# Patient Record
Sex: Female | Born: 1965 | Race: White | Hispanic: No | Marital: Married | Smoking: Former smoker
Health system: Southern US, Community
[De-identification: ages and names within clinical notes are randomized; demographics above are authoritative.]

## PROBLEM LIST (undated history)

## (undated) DIAGNOSIS — K1379 Other lesions of oral mucosa: Secondary | ICD-10-CM

## (undated) DIAGNOSIS — H269 Unspecified cataract: Secondary | ICD-10-CM

## (undated) DIAGNOSIS — K224 Dyskinesia of esophagus: Secondary | ICD-10-CM

## (undated) DIAGNOSIS — M1990 Unspecified osteoarthritis, unspecified site: Secondary | ICD-10-CM

## (undated) DIAGNOSIS — I2510 Atherosclerotic heart disease of native coronary artery without angina pectoris: Secondary | ICD-10-CM

## (undated) DIAGNOSIS — C4499 Other specified malignant neoplasm of skin, unspecified: Secondary | ICD-10-CM

## (undated) DIAGNOSIS — R011 Cardiac murmur, unspecified: Secondary | ICD-10-CM

## (undated) DIAGNOSIS — Q2112 Patent foramen ovale: Secondary | ICD-10-CM

## (undated) DIAGNOSIS — E669 Obesity, unspecified: Secondary | ICD-10-CM

## (undated) DIAGNOSIS — G4700 Insomnia, unspecified: Secondary | ICD-10-CM

## (undated) DIAGNOSIS — K219 Gastro-esophageal reflux disease without esophagitis: Secondary | ICD-10-CM

## (undated) DIAGNOSIS — D649 Anemia, unspecified: Secondary | ICD-10-CM

## (undated) DIAGNOSIS — K769 Liver disease, unspecified: Secondary | ICD-10-CM

## (undated) DIAGNOSIS — M797 Fibromyalgia: Secondary | ICD-10-CM

## (undated) DIAGNOSIS — T7840XA Allergy, unspecified, initial encounter: Secondary | ICD-10-CM

## (undated) DIAGNOSIS — D509 Iron deficiency anemia, unspecified: Secondary | ICD-10-CM

## (undated) DIAGNOSIS — K589 Irritable bowel syndrome without diarrhea: Secondary | ICD-10-CM

## (undated) DIAGNOSIS — E785 Hyperlipidemia, unspecified: Secondary | ICD-10-CM

## (undated) HISTORY — PX: OTHER SURGICAL HISTORY: SHX169

## (undated) HISTORY — DX: Atherosclerotic heart disease of native coronary artery without angina pectoris: I25.10

## (undated) HISTORY — DX: Hyperlipidemia, unspecified: E78.5

## (undated) HISTORY — DX: Gastro-esophageal reflux disease without esophagitis: K21.9

## (undated) HISTORY — DX: Cardiac murmur, unspecified: R01.1

## (undated) HISTORY — DX: Insomnia, unspecified: G47.00

## (undated) HISTORY — PX: COLONOSCOPY: SHX174

## (undated) HISTORY — DX: Irritable bowel syndrome, unspecified: K58.9

## (undated) HISTORY — DX: Unspecified osteoarthritis, unspecified site: M19.90

## (undated) HISTORY — DX: Fibromyalgia: M79.7

## (undated) HISTORY — PX: ABDOMINAL HYSTERECTOMY: SHX81

## (undated) HISTORY — DX: Unspecified cataract: H26.9

## (undated) HISTORY — DX: Allergy, unspecified, initial encounter: T78.40XA

## (undated) HISTORY — PX: UPPER GASTROINTESTINAL ENDOSCOPY: SHX188

## (undated) HISTORY — DX: Patent foramen ovale: Q21.12

## (undated) HISTORY — DX: Anemia, unspecified: D64.9

## (undated) HISTORY — DX: Liver disease, unspecified: K76.9

## (undated) HISTORY — DX: Obesity, unspecified: E66.9

## (undated) HISTORY — DX: Other specified malignant neoplasm of skin, unspecified: C44.99

## (undated) HISTORY — DX: Dyskinesia of esophagus: K22.4

## (undated) HISTORY — PX: TONSILLECTOMY: SHX5217

## (undated) HISTORY — DX: Iron deficiency anemia, unspecified: D50.9

## (undated) HISTORY — DX: Other lesions of oral mucosa: K13.79

---

## 1990-04-07 DIAGNOSIS — C801 Malignant (primary) neoplasm, unspecified: Secondary | ICD-10-CM

## 1990-04-07 HISTORY — DX: Malignant (primary) neoplasm, unspecified: C80.1

## 1999-04-08 HISTORY — PX: CHOLECYSTECTOMY: SHX55

## 2002-12-05 ENCOUNTER — Inpatient Hospital Stay (HOSPITAL_COMMUNITY): Admission: RE | Admit: 2002-12-05 | Discharge: 2002-12-08 | Payer: Self-pay | Admitting: Obstetrics and Gynecology

## 2002-12-05 ENCOUNTER — Encounter (INDEPENDENT_AMBULATORY_CARE_PROVIDER_SITE_OTHER): Payer: Self-pay | Admitting: Specialist

## 2003-06-08 ENCOUNTER — Encounter: Payer: Self-pay | Admitting: Internal Medicine

## 2003-06-08 HISTORY — PX: COLONOSCOPY: SHX5424

## 2004-03-13 ENCOUNTER — Other Ambulatory Visit: Admission: RE | Admit: 2004-03-13 | Discharge: 2004-03-13 | Payer: Self-pay | Admitting: Obstetrics and Gynecology

## 2005-05-15 ENCOUNTER — Other Ambulatory Visit: Admission: RE | Admit: 2005-05-15 | Discharge: 2005-05-15 | Payer: Self-pay | Admitting: Obstetrics and Gynecology

## 2005-06-10 ENCOUNTER — Encounter: Admission: RE | Admit: 2005-06-10 | Discharge: 2005-06-10 | Payer: Self-pay | Admitting: Obstetrics and Gynecology

## 2007-01-21 ENCOUNTER — Ambulatory Visit: Payer: Self-pay | Admitting: Internal Medicine

## 2007-01-21 ENCOUNTER — Ambulatory Visit (HOSPITAL_COMMUNITY): Admission: RE | Admit: 2007-01-21 | Discharge: 2007-01-21 | Payer: Self-pay | Admitting: Internal Medicine

## 2007-01-21 LAB — CONVERTED CEMR LAB
AST: 50 units/L — ABNORMAL HIGH (ref 0–37)
Bilirubin, Direct: 0.1 mg/dL (ref 0.0–0.3)
CO2: 26 meq/L (ref 19–32)
Calcium: 9.1 mg/dL (ref 8.4–10.5)
Creatinine, Ser: 0.8 mg/dL (ref 0.4–1.2)
Eosinophils Relative: 2.3 % (ref 0.0–5.0)
HCT: 34.5 % — ABNORMAL LOW (ref 36.0–46.0)
Hemoglobin: 11.6 g/dL — ABNORMAL LOW (ref 12.0–15.0)
IgA: 332 mg/dL (ref 68–378)
Lymphocytes Relative: 29 % (ref 12.0–46.0)
Monocytes Absolute: 0.6 10*3/uL (ref 0.2–0.7)
Monocytes Relative: 6.5 % (ref 3.0–11.0)
Potassium: 4.6 meq/L (ref 3.5–5.1)
RBC: 4.56 M/uL (ref 3.87–5.11)
RDW: 14.8 % — ABNORMAL HIGH (ref 11.5–14.6)
WBC: 8.6 10*3/uL (ref 4.5–10.5)

## 2007-02-19 ENCOUNTER — Ambulatory Visit: Payer: Self-pay | Admitting: Internal Medicine

## 2007-02-19 LAB — CONVERTED CEMR LAB
AST: 49 units/L — ABNORMAL HIGH (ref 0–37)
Albumin: 3.6 g/dL (ref 3.5–5.2)
Ferritin: 91.3 ng/mL (ref 10.0–291.0)
Total Bilirubin: 0.5 mg/dL (ref 0.3–1.2)
Total Protein: 7.3 g/dL (ref 6.0–8.3)
Transferrin: 248.9 mg/dL (ref >=212.0)

## 2007-02-23 ENCOUNTER — Ambulatory Visit: Payer: Self-pay | Admitting: Internal Medicine

## 2007-05-12 DIAGNOSIS — R1319 Other dysphagia: Secondary | ICD-10-CM

## 2007-05-12 DIAGNOSIS — K589 Irritable bowel syndrome without diarrhea: Secondary | ICD-10-CM

## 2009-10-24 ENCOUNTER — Ambulatory Visit: Payer: Self-pay | Admitting: Internal Medicine

## 2009-10-24 DIAGNOSIS — K219 Gastro-esophageal reflux disease without esophagitis: Secondary | ICD-10-CM

## 2009-10-24 DIAGNOSIS — E669 Obesity, unspecified: Secondary | ICD-10-CM

## 2009-10-25 LAB — CONVERTED CEMR LAB
ALT: 66 units/L — ABNORMAL HIGH (ref 0–35)
AST: 63 units/L — ABNORMAL HIGH (ref 0–37)
Albumin: 4 g/dL (ref 3.5–5.2)
Bilirubin, Direct: 0.1 mg/dL (ref 0.0–0.3)
Total Bilirubin: 0.6 mg/dL (ref 0.3–1.2)
Total Protein: 7.6 g/dL (ref 6.0–8.3)

## 2009-10-30 ENCOUNTER — Ambulatory Visit (HOSPITAL_COMMUNITY): Admission: RE | Admit: 2009-10-30 | Discharge: 2009-10-30 | Payer: Self-pay | Admitting: Internal Medicine

## 2009-11-20 ENCOUNTER — Telehealth: Payer: Self-pay | Admitting: Internal Medicine

## 2009-11-23 ENCOUNTER — Encounter: Payer: Self-pay | Admitting: Internal Medicine

## 2010-01-31 ENCOUNTER — Ambulatory Visit: Payer: Self-pay | Admitting: Internal Medicine

## 2010-05-07 NOTE — Progress Notes (Signed)
Summary: rx request   Phone Note Call from Patient Call back at Work Phone [phone] Call back at x [phone]   Caller: Patient Call For: Dr. Leone Payor Reason for Call: Refill Medication Summary of Call: would like to get an rx for Dexilant, samples helped... [facility] [place], [place] on [address]. Initial call taken by: Vallarie Mare,  November 20, 2009 8:56 AM  Follow-up for Phone Call        rx sent pt aware Follow-up by: Harlow Mares CMA Duncan Dull),  November 20, 2009 9:14 AM    Prescriptions: DEXILANT 60 MG CPDR (DEXLANSOPRAZOLE) 1 by mouth each morning, 30 minutes before breakfast  #30 x 6   Entered by:   Harlow Mares CMA (AAMA)   Authorized by:   Iva Boop MD, [facility]   Signed by:   Harlow Mares CMA (AAMA) on 11/20/2009   Method used:   [facility]        [facility]  [address] [phone]* (retail)       [address]       [place], [place]  [address]       Ph: [phone]       Fax: [phone]   RxID:   [id number]

## 2010-05-07 NOTE — Assessment & Plan Note (Signed)
Summary: severe acid reflux...em    History of Present Illness Visit Type: Follow-up Visit Primary GI MD: Stan Head MD [facility] Primary Provider: Thomes Cake, DO  Requesting Provider: na Chief Complaint: Severe GERD  History of Present Illness:   Last seen in 2008 with IBS and GERD. 45 yo ww, also has esopahgael spasms. She has been having recurrent chest pain and problems with them She is snoring alot also. Alot of headaches. Using ibuprofen to treat.No particular sleepiness. She has discussed possible sleep evaluation  She is having painful swallowing, burning with toothpaste in AM. Tomatoes, citrus burn. Nexium x 1 month no help, Prevacid no help, Zegerid OTC made her nauseous with some possible relief.   Mouth ulcer flare (has had problems since childhood) lately.  3+ hrs before supne at night and head of bed elevated with pillows   GI Review of Systems    Reports acid reflux, chest pain, and  heartburn.      Denies abdominal pain, belching, bloating, dysphagia with liquids, dysphagia with solids, loss of appetite, nausea, vomiting, vomiting blood, weight loss, and  weight gain.        Denies anal fissure, black tarry stools, change in bowel habit, constipation, diarrhea, diverticulosis, fecal incontinence, heme positive stool, hemorrhoids, irritable bowel syndrome, jaundice, light color stool, liver problems, rectal bleeding, and  rectal pain.    Colonoscopy  Procedure date:  06/08/2003  Findings:      Results: Normal.   EGD  Procedure date:  06/08/2003  Findings:      Findings: Normal    Procedures Next Due Date:    Colonoscopy: 06/2015   Current Medications (verified): 1)  Ambien 10 Mg Tabs (Zolpidem Tartrate) .... One Tablet By Mouth Once Daily At Bedtime 2)  Vitamin B-12 1000 Mcg Tabs (Cyanocobalamin) .... One Tablet By Mouth Once Daily 3)  Imodium A-D 2 Mg Tabs (Loperamide Hcl) .... As Needed  Allergies (verified): 1)  ! Percocet  Past  History:  Past Medical History: IRRITABLE BOWEL SYNDROME (ICD-564.1) ANEMIA, IRON DEFICIENCY (ICD-280.9) GERD MOUTH ULCERS FIBROMYALGIA ESOPHAGEAL SPASMS  Family History: No FH of Colon Cancer:  Social History: Occupation:  [employer]  Married Patient is a former smoker.  Alcohol Use - no Daily Caffeine Use: Diet Coke daily  Illicit Drug Use - no Smoking Status:  quit Drug Use:  no Caffeine use/day:  20 oz diet coke daily Passive Smoke Exposure:  no Alcohol drinks/day:  0 Does Patient Exercise:  water aerobics for fibromyalgia  Preventive Screening-Counseling & Management  Alcohol-Tobacco     Alcohol drinks/day: 0     Smoking Status: quit     Passive Smoke Exposure: no     Tobacco Counseling: not indicated; no tobacco use     Passive Smoke Counseling: not indicated; no passive smoke exposure  Caffeine-Diet-Exercise     Caffeine use/day: 20 oz diet coke daily     Caffeine Counseling: decrease use of caffeine     Diet Counseling: to improve diet; diet is suboptimal     Does Patient Exercise: water aerobics for fibromyalgia     Times/week: 4      Drug Use:  no.     Review of Systems       as per HPI  Vital Signs:  Patient profile:   45 year old female Height:      66 inches Weight:      239 pounds BMI:     38.72 BSA:  2.16 Pulse rate:   76 / minute Pulse rhythm:   regular BP sitting:   132 / 84  (left arm) Cuff size:   regular  Vitals Entered By: Ok Anis CMA (October 24, 2009 4:00 PM)  Physical Exam  General:  obese.   Mouth:  ulcers on buccal mucosa and tongue Lungs:  Clear throughout to auscultation. Heart:  Regular rate and rhythm; no murmurs, rubs,  or bruits. Abdomen:  obese, soft mildly diffuselytender and sensitve Extremities:  no edema Psych:  Alert and cooperative. Normal mood and affect.   Impression & Recommendations:  Problem # 1:  GERD (ICD-530.81) Assessment Deteriorated has failed Nexium x 1 month, failed Prevacid,  did not tolerate Zegerid OTC. EGD 2005 without esophagitis. Will try Dexilant 60 mg/day. Samples and discount card given. She is obese but lifestyle factors otherwise ok.  Problem # 2:  IRRITABLE BOWEL SYNDROME (ICD-564.1) Assessment: Improved as needed Imodium seems to be working  Problem # 3:  TRANSAMINASES, SERUM, ELEVATED (ICD-790.4) Assessment: Unchanged suspoected fatty liver based upon 2005 evaluation  Orders: TLB-Hepatic/Liver Function Pnl (80076-HEPATIC)  Problem # 4:  OBESITY (ICD-278.00) Assessment: New we discussed need to lose weight which will help GERD and overall health  Patient Instructions: 1)  You need to lose weight. Start by limiting portions, amounts. Avoid eating when not hungry. Limit desserts.Look for high fructose corn syrup on food labels and if in first 3 ingredients, avoid that food. Also try to eat whole grains, avoid "white foods" (e.g. white rice, white bread). 2)  Please go to the basement to have your lab tests drawn today.  3)  We will call you with further follow up after reviewing these results.  4)  Please be sure to follow up sleep apnea diagnosis. 5)  Samples of Dexilant have been given.  Please call our office in 3 weeks with an update on how the Dexilant is working.  If it is working well we will call in a prescription for you. 6)  Copy sent to : Lorelei Pont, MD 7)  The medication list was reviewed and reconciled.  All changed / newly prescribed medications were explained.  A complete medication list was provided to the patient / caregiver.

## 2010-05-07 NOTE — Medication Information (Signed)
Summary: P A and Approval/medco  P A and Approval/medco   Imported By: [place] 11/28/2009 08:04:07  _____________________________________________________________________  External Attachment:    Type:   Image     Comment:   External Document

## 2010-08-20 NOTE — Assessment & Plan Note (Signed)
[facility]                         GASTROENTEROLOGY OFFICE NOTE   NAME:Paige Johns, Paige Johns                   MRN:          [id number]  DATE:02/23/2007                            DOB:          1965/09/28    CHIEF COMPLAINT:  Followup of labs, IBS, barium swallow.   See note of January 21, 2007.  She had indigestion, heartburn, diarrhea  and gas.  My working diagnoses were irritable bowel syndrome, diarrhea  predominant.  She had dysphagia issues, diffuse abdominal pain and  heartburn.   Workup demonstrated hemoglobin 11.6, MCV 75.7.  Her iron saturation was  6.9% with a low iron of 24 and a low-normal transferrin of 248.  The  ferritin was 91.  LFTs were 49 and 75 for AST and ALT, otherwise normal  LFTs; that was just this month.  Back in October, the AST and ALT were  50 and 61.  She had a tissue transglutaminase antibody and an IgA level  were normal, i.e., no celiac disease.  She does relate a history of  previous abnormal liver enzymes in the past and she could not donate  blood.  Stool workup with fecal lactoferrin, Clostridium difficile toxin  and culture were negative.  She had a Cryptosporidium and Giardia screen  which as negative.  Barium swallow showed a small sliding hiatal hernia  with reflux and mild spasm.   MEDICATIONS:  Listed and reviewed in the chart.  $45 for pantoprazole  was too expensive, as was Nexium.  She is on Prilosec OTC daily with  some benefit, but incomplete.  Hyoscyamine makes her sleepy, so she is  taking it at bedtime.  Imodium is used, but she is using it after she  has diarrhea intermittently and that is a little hard to schedule.   PHYSICAL EXAMINATION:  Obese, no acute distress.  Weight 235 pounds, pulse 80, blood pressure 120/70.   ASSESSMENT:  1. I think she has diarrhea-predominant irritable bowel syndrome.  2. Gastroesophageal reflux disease.  3. Abnormal transaminases.  I suspect she probably has fatty  liver.   PLAN:  1. ANA, hepatitis C antibody, hepatitis B surface antigen.  2. Prilosec OTC 1-2 a day; I think that is the most cost-effective      approach for her for PPI.  3. Dicyclomine 10 mg one q.6 h. p.r.n. cramps and diarrhea.  She can      take it before meals.  4. Imodium can be used as well daily to try to control diarrhea.  5. We will call with the results of the labs otherwise.  I suspect I      would follow her up in a few months and repeat LFTs, and could      perhaps need adjustment of her medications in the interim.  Note:      I mentioned to her that she should take an iron tablet a day and      when we call her back, we will try to check up on that.  At some      point down the road, we should also follow up on her  CBC.  She      really seems to have sort of a mixed picture.  Her ferritin is      acceptable at 91, i.e., that looks normal and the low iron      saturation is in the setting of a low      iron and a low transferrin or TIBC, so that is more consistent with      chronic disease.  There is      a history of anemia related to menorrhagia.  Previous EGD and      colonoscopy are unrevealing.     Iva Boop, MD,FACG  Electronically Signed    CEG/MedQ  DD: 02/23/2007  DT: 02/24/2007  Job #: [id number]

## 2010-08-20 NOTE — Assessment & Plan Note (Signed)
[facility]                         GASTROENTEROLOGY OFFICE NOTE   NAME:Johns, Paige BOVA                   MRN:          [id number]  DATE:01/21/2007                            DOB:          1966-04-07    CHIEF COMPLAINT:  Self referred patient complaining of indigestion,  heart burn, diarrhea and gas.   HISTORY:  This is a 45 year old white woman that had an EGD and  colonoscopy through direct schedule in March, 2005, these were both  normal. She had an anemia at that time and I thought that maybe it was  due to previous menorrhagia.  She had a history of hysterectomy at that  time.  At this time she has multiple complaints that include:  1. Mainly a globus sensation of some liquid and pill dysphagia at that      time. Sometimes a sip of water will feel like a boulder.  2. Cramping abdominal pain with liquid stools daily for the last 4      months, mostly after she eats, and not really nocturnal. She has      had some bright red blood per rectum and perhaps even some melena.      Imodium seems to help at two to four doses a day.  There was no      prior gastroenteritis, no increase in stress though she does think      she has irritable bowel syndrome, no prior antibiotics.  She has      had a few mouth ulcers.  There is no trouble with milk, she does      not use artificial sweeteners regularly other than that in diet      soda, i.e., no sorbitol.   MEDICATIONS:  1. Ambien 10 mg at h.s. for years.  2. Celebrex 200 mg daily.  3. Zantac p.r.n.  4. Imodium p.r.n.  5. Preparation-H.   PAST MEDICAL HISTORY:  1. Tonsillectomy.  2. Irritable bowel syndrome.  3. Prior hysterectomy.  4. Negative EGD and colonoscopy.  5. 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  6. Cholecystectomy in 2001.   ALLERGIES:  PERCOCET.   FAMILY HISTORY:  Mother had diverticulosis and diabetes.  No colon  cancer.   SOCIAL  HISTORY:  She is married. She is a Chief Operating Officer.  She lives in [place], [place].  No alcohol,  tobacco or drugs.   REVIEW OF SYSTEMS:  See my medical history form for full details of  that.  She does complain of a sore throat at times.   PHYSICAL EXAMINATION:  GENERAL APPEARANCE:  Exam reveals an obese white  woman looking her stated age, in no acute distress. She is middle-aged.  VITAL SIGNS:  Height 5 feet, 5-1/2 inches, weight 233 pounds. Blood  pressure 102/74, pulse 68.  HEENT:  Eyes anicteric.  Normal mouth, posterior pharynx.  NECK:  Supple, no thyromegaly or mass.  LUNGS:  Clear.  HEART:  S1, S2 no rubs, murmurs or gallops.  ABDOMEN:  Slightly tender in a diffuse fashion without organomegaly or  mass,  it is soft.  LYMPHATIC:  No neck or supraclavicular nodes.  EXTREMITIES:  No edema.  SKIN:  No rash.  PSYCHE:  She is alert and oriented x3.   ASSESSMENT:  1. Chronic diarrhea, unclear etiology, question irritable bowel      syndrome.  2. Dysphagia problems.  3. Diffuse abdominal pain.  4. Heart burn.   PLAN:  1. Levbid b.i.d. for abdominal cramps and suspected irritable bowel      syndrome.  2. Pantoprazole 40 mg daily for reflux and dysphagia symptoms.  3. Barium swallow with tablet (she had a negative EGD in '05).  4. CBC with differential, CMET, sed rate, stool culture, C. difficile      toxin, ova and parasites, screening fecal lactoferrin, TSH, tissue      transglutaminase, antibodies and IGA level.  5. Low residue diet.   I will call these results and check on her response to the empiric  therapy.  Inflammatory bowel disease or irritable bowel syndrome seems  most likely at this time.  Sounds like she has a component of reflux.  She has been on Ambien for chronic insomnia which raises some question  of anxiety depression type problems and possible functional overlay.     Iva Boop, MD,FACG  Electronically Signed     CEG/MedQ  DD: 01/21/2007  DT: 01/22/2007  Job #: [id number]

## 2010-08-23 NOTE — Op Note (Signed)
NAME:  Paige Johns, Paige Johns NO.:  [id number]   MEDICAL RECORD NO.:  [id number]                   PATIENT TYPE:  INP   LOCATION:  0010                                 FACILITY:  [facility]   PHYSICIAN:  Valetta Fuller, M.D.               DATE OF BIRTH:  Jun 14, 1965   DATE OF PROCEDURE:  12/05/2002  DATE OF DISCHARGE:                                 OPERATIVE REPORT   PREOPERATIVE DIAGNOSIS:  Stress incontinence.   POSTOPERATIVE DIAGNOSIS:  Stress incontinence.   PROCEDURE PERFORMED:  Transobturator suburethral sling.   SURGEON:  Valetta Fuller, M.D.   ANESTHESIA:  General.   INDICATIONS FOR PROCEDURE:  [name] is a 45 year old female. She has had  dysfunctional uterine bleeding and uterine prolapse and is to have a vaginal  hysterectomy. She also has some moderate irritative voiding symptoms and  some mild urge and moderate stress incontinence. She has been diagnosed with  a cystocele and urethral hypermobility. She was documented in having  objective stress incontinence and we discussed with her the option of having  an elective anti-incontinence procedure at the same time as her hysterectomy  and anterior repair. Dr. Rana Snare was planning was on doing the vaginal  hysterectomy and anterior repair and we would come at the end and place a  Transobturator sling. The patient appeared to understand the advantages and  disadvantages of this as well as the potential complications and pitfalls.  Full informed consent was obtained.   TECHNIQUE:  The patient was already asleep and in high lithotomy position  when we entered the operating room. Her vaginal hysterectomy had been  performed, the anterior repair had been completed although the last part of  the incision had not been completely closed. We changed her postioning to  moderate lithotomy. A weighted vaginal speculum was utilized and a Foley  catheter was placed. We were able to palpate the bladder neck  region and the  mid urethra. We marked two areas for the obturator incision approximately at  the level of the clitoris 4 to 4 1/2 cm lateral. Utilizing the special  needle passers, we were able to bring these out the vaginal incision with  direct digital finger control on both sides. The sling was then positioned  in the distal urethra with a right angled clamp underneath  the sling. Once positioning and tension was confirmed to be good, we trimmed  the sling. We then copiously irrigated the vaginal incision. We closed the  vaginal incision with running 2-0 Vicryl suture completing the closure that  had already been done by Dr. Rana Snare. He will dictate the rest of the  operation.  Valetta Fuller, M.D.    DSG/MEDQ  D:  12/05/2002  T:  12/05/2002  Job:  [id number]   cc:   Dineen Kid. Rana Snare, M.D.  [address]  [place]  [place] [address]  Fax: [phone]

## 2010-08-23 NOTE — H&P (Signed)
NAME:  Paige Johns, HUSH                      ACCOUNT NO.:  [id number]   MEDICAL RECORD NO.:  [id number]                   PATIENT TYPE:  INP   LOCATION:  NA                                   FACILITY:  [facility]   PHYSICIAN:  Dineen Kid. Rana Snare, M.D.                 DATE OF BIRTH:  1966-03-18   DATE OF ADMISSION:  DATE OF DISCHARGE:                                HISTORY & PHYSICAL   HISTORY OF PRESENT ILLNESS:  Paige Johns is a 45 year old G2, P2, with  menorrhagia and dysmenorrhea, previously on birth control pills with no  benefit for her incapacitating periods.  She also has symptomatic pelvic  relaxation with pressure symptoms, stress urinary incontinence, and also  digitalization for bowel movements.  She presents for definitive surgical  treatment, planned hysterectomy with anterior-posterior colporrhaphy in a  combined procedure with a bladder suspension by Dr. Isabel Caprice.   PAST MEDICAL HISTORY:  1. Significant for history of gastroesophageal reflux.  2. Constipation.  3. History of migraines.  4. History of anemia.   PAST SURGICAL HISTORY:  1. Significant for cholecystectomy.  2. Tonsillectomy and adenoidectomy.   PAST OB HISTORY:  She has had two vaginal deliveries.   MEDICATIONS:  1. She is currently on Protonix.  2. Flexeril.  3. Celebrex.  4. MiraLax.   ALLERGIES:  She is allergic to CODEINE products, including PERCOCET.   PHYSICAL EXAMINATION:  HEART:  Regular rate and rhythm.  LUNGS:  Clear to auscultation bilaterally.  ABDOMEN:  Nondistended, nontender.  PELVIC:  She has normal external genitalia, Bartholin's and Skene's,  urethra.  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  She has a second to third  degree rectocele which is prominent upon Valsalva.   She does have a normal  perineal body and normal rectal tone.   IMPRESSION/PLAN:  1. 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  2. Menorrhagia.  3. Dysmenorrhea.  The patient desires definitive surgical evaluation and     treatment.   PLAN:  Laparoscopic-assisted vaginal hysterectomy with anterior and  posterior colporrhaphy, possible sacral spinous suspension of the vaginal  apex.  Will do this as a combined procedure with Dr. Elana Alm, urologist,  with a transvaginal tape bladder sling.  Risks and benefits were discussed  at length with the patient and her husband, which include, but are not  limited to, risk of infection, bleeding, damage to bowel, bladder, ureters,  tubes, ovaries, possibility of recurrence of symptoms or worsening of  pressure symptoms.  The patient understands that the risks of surgery also  include risk of injury to the bowel, which could require laparotomy or  bladder, risk associated with anesthesia, risk associated with blood  transfusion.  We  will try to preserve both her ovaries; however, she  understands that if one or both need to go, she would consent to that.  She  does give her informed consent and all of her questions were answered.                                                Dineen Kid Rana Snare, M.D.    DCL/MEDQ  D:  11/28/2002  T:  11/28/2002  Job:  [id number]

## 2010-08-23 NOTE — Discharge Summary (Signed)
NAME:  Paige Johns, SOLBERG                      ACCOUNT NO.:  [id number]   MEDICAL RECORD NO.:  [id number]                   PATIENT TYPE:  INP   LOCATION:  0444                                 FACILITY:  [facility]   PHYSICIAN:  Dineen Kid. Rana Snare, M.D.                 DATE OF BIRTH:  Aug 01, 1965   DATE OF ADMISSION:  12/05/2002  DATE OF DISCHARGE:  12/08/2002                                 DISCHARGE SUMMARY   HISTORY OF PRESENT ILLNESS:  Ms. Paige Johns is a 45 year old G2, P2 with  worsening dysmenorrhea, menorrhagia, also symptoms pelvic relaxation,  symptoms of urinary incontinence, pelvic symptoms, and occasional  digitalization.  She desires definitive surgical intervention and plan  laparoscopically assisted vaginal hysterectomy with anterior/posterior  colporrhaphy with a bladder sling by Valetta Fuller, M.D.   HOSPITAL COURSE:  The patient was admitted and underwent an LAVH/A&P repair  with lysis of adhesions.  The surgery was complicated by blood loss up to  600 mL and also findings were adhesions from the descending colon to the  left pelvic side wall and ovaries which were sharply lysed during surgery.  The bladder sling by Valetta Fuller, M.D. was also uncomplicated.  Her  postoperative care was remarkable for a hemoglobin on postoperative day of  8.6.  She did have good return of bowel function and vaginal packing was  removed on postoperative day #1.  By postoperative day #2 she was having  difficulty with voiding but by the end of postoperative day #2 was voiding  normally without difficulty.  She did have a mild fever up to 100.0.  She  was begun on Cipro and she remained afebrile and by postoperative day #3 had  remained afebrile for 24 hours, tolerating a regular diet, ambulating  without difficulty, having normal voids, and was discharged home.  Her  hemoglobin on postoperative day #3 was 7.4 and a white count of 9.4.  Her  abdomen was soft, nontender.  Bowel sounds were  normoactive.  Her incisions  were clean, dry, and intact.  The patient was discharged home.   DISPOSITION:  The patient will be discharged home.  Will follow up in the  office in one week with Valetta Fuller, M.D. and in two weeks with Dineen Kid.  Rana Snare, M.D.  She is to return for increased pain, fever, or bleeding.  She  will get her hemoglobin checked in three days at Valetta Fuller, M.D.  office.  Prescriptions were given for ciprofloxacin 500 mg p.o. b.i.d. x7  days, Demerol 50 mg q.4h. as needed #30, and Motrin 800 mg q.8h. as needed  #30.                                               Dineen Kid Rana Snare,  M.D.    DCL/MEDQ  D:  12/08/2002  T:  12/08/2002  Job:  [id number]

## 2010-08-23 NOTE — Op Note (Signed)
NAME:  Paige Johns, Paige Johns                      ACCOUNT NO.:  [id number]   MEDICAL RECORD NO.:  [id number]                   PATIENT TYPE:  INP   LOCATION:  0444                                 FACILITY:  [facility]   PHYSICIAN:  Dineen Kid. Rana Snare, M.D.                 DATE OF BIRTH:  03/22/1966   DATE OF PROCEDURE:  12/05/2002  DATE OF DISCHARGE:                                 OPERATIVE REPORT   PREOPERATIVE DIAGNOSES:  1. 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  2. Menorrhagia.  3. Dysmenorrhea.   POSTOPERATIVE DIAGNOSES:  1. 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  2. Menorrhagia.  3. Dysmenorrhea.  4. Pelvic adhesions.   PROCEDURES:  1. Laparoscopically-assisted vaginal hysterectomy with lysis of adhesions     and anterior and posterior colporrhaphy.  2. Transobturator sling by Valetta Fuller, M.D.   ASSISTANT:  Dineen Kid. Rana Snare, M.D.   ASSISTANTFreddy Finner, M.D.   ANESTHESIA:  General endotracheal.   INDICATIONS:  Ms. Ransom is a 45 year old G2, P2, with worsening  dysmenorrhea and menorrhagia.  She also had symptomatic pelvic relaxation  with symptoms of urinary incontinence and pelvic symptoms and also  occasional digitalization.  She desires definitive surgical intervention and  treatment of planned laparoscopically-assisted vaginal hysterectomy with  anterior-posterior colporrhaphy with sling procedure per Dr. Isabel Caprice.  Risks  and benefits were discussed at length, which include but not limited to risk  of infection, bleeding, damage to bowel, bladder, ureters, risks associated  with anesthesia, risks associated with blood loss or transfusion.  The  patient also understands that this procedure may or may not be permanent, it  may recur or not alleviate the problem, and she may continue to have pelvic  pain or pressure symptoms.  She does give her informed consent.   FINDINGS AT THE TIME OF SURGERY:  Adhesions from the  descending colon to the  left pelvic sidewall and left ovary.  Otherwise normal-appearing uterus,  tubes, ovaries, appendix, and liver.   DESCRIPTION OF PROCEDURE:  After adequate analgesia, the patient was placed  in the dorsal lithotomy position.  She was sterilely prepped and draped, the  bladder was sterilely drained, a Hulka tenaculum was placed on the anterior  lip of the cervix.  A 1 cm infraumbilical skin incision was made, the Veress  needle was inserted, and the abdomen was insufflated to dullness to  percussion.  An 11 mm trocar was inserted.  The laparoscope was inserted and  the above findings were noted.  A 5 mm trocar was inserted to the left of  the midline two fingerbreadths above the pubic symphysis.  A Gyrus tripolar  ligature was used to dissect and cauterize the adhesions from the descending  colon to the left pelvic sidewall.  After these were adequately freed, the  left uterosacral ligament was identified, clamped, cut, and ligated down  across the inferior portion  of the round ligament.  This was done similarly  along the right utero-ovarian ligament with the right ovary and tube and  left ovary and tube falling toward the pelvic sidewalls and good hemostasis  achieved.  The bladder was elevated, a small window in the vesicouterine  flap was created, noted to be hemostatic.  The abdomen was desufflated, the  legs were elevated.  The cervix was circumscribed with Bovie cautery.  Posterior colpotomy was performed.  A LigaSure was placed across the  uterosacral ligaments on the left and the right.  Cauterization was  performed, and this was dissected with the Mayo scissors.  This was  performed across the cardinal ligaments and across the bladder pillars  bilaterally.  There was a moderate amount of bleeding from the vaginal cuff.  A suture of 0 Monocryl was used to place the posterior approach into the  posterior vaginal cuff.  The anterior vaginal mucosa was  dissected off the  anterior cervix and bladder, and the anterior peritoneum was entered sharply  and a Deaver retractor was placed.  The LigaSure was used to continue the  successive bites along the inferior portion of the broad ligament with good  hemostasis achieved and dissected across the broad ligament.  The uterus was  easily removed.  Sutures of 0 Monocryl were placed in figure-of-eights at  the uterosacral ligaments.  The posterior peritoneum was then closed with 0  Monocryl in a running fashion.  The posterior vaginal mucosa was closed with  figure-of-eights of 0 Monocryl with good approximation and good hemostasis.   The anterior vaginal mucosa was then undermined and reflected laterally  beginning at the apex toward the urethral meatus approximately 1 cm from the  urethral meatus.  It was dissected laterally, reflecting the pubocervical  fascia.  Hemostasis was achieved with Bovie cautery.  The pubocervical  fascia was then plicated in the midline using 0 Monocryl sutures in figure-  of-eights, plicating the anterior vaginal mucosa up to approximately 1 cm  from the urethral meatus.  The excess vaginal mucosa was excised and the  anterior vaginal mucosa was closed with 2-0 Monocryl in a running fashion,  leaving approximately 1.5 cm free for Dr. Isabel Caprice to perform the  transobturator sling.  After performing the transobturator sling, posterior  colporrhaphy was performed.   Posterior colporrhaphy was begun by placing two Allis clamps at the edge of  the introitus.  A perineal body triangular flap was created with a scalpel  and excised.  Posterior vaginal mucosa was then undermined with Metzenbaum  scissors, dissected in the midline, and reflected laterally, removing the  rectal fascia from the posterior vaginal mucosa.  The rectal fascia was then  plicated in the midline using sutures of 0 Monocryl in figure-of-eights with good approximation.  The bulbocavernosus and the  superficial transverse  perinei muscles were also plicated in the midline using a figure-of-eight of  0 Monocryl.  Excess posterior vaginal mucosa was excised and the posterior  vaginal mucosa was then closed with 2-0 Monocryl in a running fashion.  The  superficial transverse perinei muscles were plicated again in the midline  with 2-0 Monocryl and the perineal body skin was closed with a subcuticular  stitch of a 2-0 Monocryl in a running fashion.  Examination of the vagina at  this point revealed good support of the cystocele, urethrocele, good vaginal  apex support, and rectocele support was excellent.  Rectovaginal confirmed  the above.  The vagina was packed with  estrogen-coated two-inch gauze.  The  legs were repositioned and the abdomen reinsufflated.  Examination of the  cul-de-sac revealed moderate bleeding from the posterior portion of the  bladder.  Attempts at hemostasis were unsuccessful due to the venous oozing.  At this point Surgicel was placed across the base of the bladder with good  hemostasis achieved.  The abdomen was desufflated, the trocars removed.  The  infraumbilical skin incision was closed with 0 Vicryl in the fascia and an  interrupted suture of 3-0 Rapide subcuticular stitch.  The 5 mm site was  closed with a 3-0 Vicryl Rapide subcuticular stitch.  The incisions were  injected with 0.25% Marcaine 10 mL total used, and the patient was sent to  the recovery room in stable condition.  Sponge, instrument, and needle count  was normal x3.  Estimated blood loss from the procedure was 600 mL.  The  patient will be continued on Cefotan for 24 hours, and she was given 1 g  preoperatively.                                               Dineen Kid Rana Snare, M.D.    DCL/MEDQ  D:  12/05/2002  T:  12/05/2002  Job:  [id number]

## 2011-02-13 ENCOUNTER — Ambulatory Visit (INDEPENDENT_AMBULATORY_CARE_PROVIDER_SITE_OTHER): Payer: BC Managed Care – PPO | Admitting: Internal Medicine

## 2011-02-13 ENCOUNTER — Encounter: Payer: Self-pay | Admitting: Internal Medicine

## 2011-02-13 DIAGNOSIS — K219 Gastro-esophageal reflux disease without esophagitis: Secondary | ICD-10-CM

## 2011-02-13 DIAGNOSIS — K589 Irritable bowel syndrome without diarrhea: Secondary | ICD-10-CM

## 2011-02-13 DIAGNOSIS — R1319 Other dysphagia: Secondary | ICD-10-CM

## 2011-02-13 NOTE — Assessment & Plan Note (Signed)
Not responding to PPI at this point - OTC meds and recent Nexium Use antacids prn until EGD

## 2011-02-13 NOTE — Patient Instructions (Addendum)
Use over the counter antacids as needed until you have the endoscopy test and possible dilation. If chronic medication is needed, will prescribe that after the upper endoscopy. You have been scheduled for an Endoscopy with separate instructions given.

## 2011-02-13 NOTE — Progress Notes (Signed)
  Subjective:    Patient ID: Paige Johns, female    DOB: 1965-07-08, 45 y.o.   MRN: [id number]  HPI Is 45 year old white woman is known to me with GERD and IBS. She's also had mild esophageal spasm on barium swallow in the past. Have not seen her in a year or more. She is describing intermittent solid and liquid dysphagia with a midsternal sticking point. There also may be some globus sensation ongoing. She had been using over-the-counter heartburn medications and took a month of Nexium which made no difference. She's had some nausea but no vomiting. There is no abdominal pain. IBS has been doing well for some time and no need for loperamide.  She has stopped caffeine.  He does not feel particularly stressed to her second daughter went off to college and she's had a bit of emptiness syndrome. Her mother is also ill. Overall she says she does not feel particularly anxious however.   Review of Systems She was diagnosed with diabetes last year. She lost weight as part of that when her blood sugars were not controlled. She is using metformin most days, her last hemoglobin A1c was in the 5 range.    Objective:   Physical Exam Obese no acute distress Lungs clear Heart S1-S2 no rubs or gallops The abdomen is obese soft and nontender without organomegaly or mass Mood and affect are appropriate       Assessment & Plan:

## 2011-02-13 NOTE — Assessment & Plan Note (Signed)
Recurrent - ? New stricture vs. Motility EGD with possible esophageal dilation

## 2011-02-14 ENCOUNTER — Encounter: Payer: Self-pay | Admitting: Internal Medicine

## 2011-02-25 ENCOUNTER — Ambulatory Visit (AMBULATORY_SURGERY_CENTER): Payer: BC Managed Care – PPO | Admitting: Internal Medicine

## 2011-02-25 ENCOUNTER — Encounter: Payer: Self-pay | Admitting: Internal Medicine

## 2011-02-25 VITALS — BP 110/72 | HR 62 | Temp 97.2°F | Resp 15 | Ht 65.5 in | Wt 219.0 lb

## 2011-02-25 DIAGNOSIS — K589 Irritable bowel syndrome without diarrhea: Secondary | ICD-10-CM

## 2011-02-25 DIAGNOSIS — R1319 Other dysphagia: Secondary | ICD-10-CM

## 2011-02-25 DIAGNOSIS — K219 Gastro-esophageal reflux disease without esophagitis: Secondary | ICD-10-CM

## 2011-02-25 MED ORDER — SODIUM CHLORIDE 0.9 % IV SOLN: 500.0000 mL | INTRAVENOUS | Status: DC

## 2011-02-25 MED ORDER — DEXLANSOPRAZOLE 60 MG PO CPDR: 60.0000 mg | DELAYED_RELEASE_CAPSULE | Freq: Every day | ORAL | Status: DC

## 2011-02-25 NOTE — Progress Notes (Signed)
Pt was anxious when she arrived into the procedure room.  She tolerated the EGD with Dilatation exam very well. maw

## 2011-02-25 NOTE — Progress Notes (Signed)
Patient did not experience any of the following events: a burn prior to discharge; a fall within the facility; wrong site/side/patient/procedure/implant event; or a hospital transfer or hospital admission upon discharge from the facility. (G8907) Patient did not have preoperative order for IV antibiotic SSI prophylaxis. (G8918)  

## 2011-02-25 NOTE — Patient Instructions (Addendum)
There were erosions in the esophagus consistent with acid reflux damage. There was no obvious stricture or narrowing area but given your symptoms I performed a dilation of the esophagus. Hopefully this will help your swallowing problems.  I have prescribed Dexilant, I have provided samples and a discount card which should make this affordable. Let me know if that is not the case. Please also follow a reflux diet, and continue to try to lose weight. This will help your acid reflux.  Follow-up with me as needed.  Iva Boop, MD, [facility]

## 2011-02-26 ENCOUNTER — Telehealth: Payer: Self-pay | Admitting: *Deleted

## 2011-02-26 NOTE — Telephone Encounter (Signed)
Message left to call if necessary. 

## 2014-08-29 ENCOUNTER — Other Ambulatory Visit: Payer: Self-pay | Admitting: Obstetrics and Gynecology

## 2014-08-29 DIAGNOSIS — R928 Other abnormal and inconclusive findings on diagnostic imaging of breast: Secondary | ICD-10-CM

## 2014-09-12 ENCOUNTER — Ambulatory Visit
Admission: RE | Admit: 2014-09-12 | Discharge: 2014-09-12 | Disposition: A | Discharge status: Home / Self Care | Payer: BLUE CROSS/BLUE SHIELD | Source: Ambulatory Visit | Attending: Obstetrics and Gynecology | Admitting: Obstetrics and Gynecology

## 2014-09-12 DIAGNOSIS — R928 Other abnormal and inconclusive findings on diagnostic imaging of breast: Secondary | ICD-10-CM

## 2014-11-06 ENCOUNTER — Encounter: Payer: Self-pay | Admitting: Internal Medicine

## 2015-01-09 ENCOUNTER — Encounter: Payer: Self-pay | Admitting: Internal Medicine

## 2015-01-09 ENCOUNTER — Ambulatory Visit (INDEPENDENT_AMBULATORY_CARE_PROVIDER_SITE_OTHER): Payer: BLUE CROSS/BLUE SHIELD | Admitting: Internal Medicine

## 2015-01-09 VITALS — BP 116/80 | HR 76 | Ht 65.0 in | Wt 234.4 lb

## 2015-01-09 DIAGNOSIS — F458 Other somatoform disorders: Secondary | ICD-10-CM | POA: Diagnosis not present

## 2015-01-09 DIAGNOSIS — R09A2 Foreign body sensation, throat: Secondary | ICD-10-CM

## 2015-01-09 DIAGNOSIS — R159 Full incontinence of feces: Secondary | ICD-10-CM

## 2015-01-09 DIAGNOSIS — K2100 Gastro-esophageal reflux disease with esophagitis, without bleeding: Secondary | ICD-10-CM

## 2015-01-09 DIAGNOSIS — K589 Irritable bowel syndrome without diarrhea: Secondary | ICD-10-CM

## 2015-01-09 DIAGNOSIS — K648 Other hemorrhoids: Secondary | ICD-10-CM | POA: Diagnosis not present

## 2015-01-09 DIAGNOSIS — R1310 Dysphagia, unspecified: Secondary | ICD-10-CM

## 2015-01-09 DIAGNOSIS — R0989 Other specified symptoms and signs involving the circulatory and respiratory systems: Secondary | ICD-10-CM

## 2015-01-09 DIAGNOSIS — K641 Second degree hemorrhoids: Secondary | ICD-10-CM

## 2015-01-09 MED ORDER — OMEPRAZOLE 40 MG PO CPDR: 40.0000 mg | DELAYED_RELEASE_CAPSULE | Freq: Two times a day (BID) | ORAL | Status: DC

## 2015-01-09 MED ORDER — HYDROCORTISONE ACETATE 25 MG RE SUPP: 25.0000 mg | Freq: Every day | RECTAL | Status: DC

## 2015-01-09 NOTE — Patient Instructions (Addendum)
Today you have been given a handout on benefiber, use 1-2 tablespoons daily.   We have sent the following medications to your pharmacy for you to pick up at your convenience: Anusol [facility] suppositories   Increase your omeprazole to twice daily, take 30 minutes before breakfast and supper.  Rx sent in.  Follow up with Dr Carlean Purl in December.  I appreciate the opportunity to care for you. Silvano Rusk, MD, [facility]

## 2015-01-09 NOTE — Progress Notes (Signed)
Referred by: Emelda Fear, DO  Subjective:    Patient ID: Paige Johns, female    DOB: 1965/10/01, 49 y.o.   MRN: [id number] Chief complaint swallowing problems and fecal leakage HPI Patient is a very nice 49 year old married woman I know from previous examinations. She is complaining of choking a lot even without eating. She has a globus sensation and some impact solid food dysphagia. However she will also has some similar sensations human she swallows saliva. She is on omeprazole 40 mg daily but still has heartburn. In the past she thought Nexium worked fairly well and I believe she was on [place] in the past as well. She is trying to follow reflux precautions and not eat late at night etc. She had an EGD with [facility] dilation, empiric without stricture in 2012 that seemed to provide some benefit back then though she cannot recall exactly. Her current symptoms are present for a few months it seems like.  She has intermittent mild fecal soiling noticing stool in the gluteal crease sore in her underwear at times. She has had some rare watery incontinence stool. Rare bright red rectal bleeding on the paper. She has not tried any antidiarrheal or more fiber.  She has had some stress. Her mother died last year, there is a lawsuit pending regarding that because she was rolled out of bed and fractured both her femurs and then died. Patient does have a history of IBS which can be stress related. Allergies  Allergen Reactions  . Oxycodone-Acetaminophen Rash    PERCOCET   Outpatient Prescriptions Prior to Visit  Medication Sig Dispense Refill  . ALPRAZolam (XANAX) 0.25 MG tablet Take 1 tablet by mouth as needed.    . cyclobenzaprine (FLEXERIL) 10 MG tablet Take 1 tablet by mouth At bedtime as needed.    Marland Kitchen ibuprofen (ADVIL,MOTRIN) 200 MG tablet Take 1-2 tablets as needed for headaches     . loperamide (IMODIUM A-D) 2 MG tablet Take 2 mg by mouth as needed.      . loratadine (CLARITIN) 10 MG  tablet Take 10 mg by mouth daily.      . metFORMIN (GLUCOPHAGE) 500 MG tablet Take 500 mg by mouth daily after supper.      . zolpidem (AMBIEN) 10 MG tablet Take 10 mg by mouth at bedtime as needed.      . fluticasone (FLONASE) 50 MCG/ACT nasal spray Place 1 spray into the nose as needed.     Facility-Administered Medications Prior to Visit  Medication Dose Route Frequency Provider Last Rate Last Dose  . 0.9 %  sodium chloride infusion  500 mL Intravenous Continuous Gatha Mayer, MD       Past Medical History  Diagnosis Date  . IBS (irritable bowel syndrome)   . Iron deficiency anemia   . GERD (gastroesophageal reflux disease)   . Mouth sores     ulcers  . Fibromyalgia   . Esophageal spasm   . Diabetes mellitus   . Dermatofibrosarcoma protuberans   . Arthritis    Past Surgical History  Procedure Laterality Date  . Tonsillectomy    . Abdominal hysterectomy    . Groin surgery Right     dermato fibrosarcoma protuberans  . Cholecystectomy  2001  . Colonoscopy  06/08/2003    normal  . Upper gastrointestinal endoscopy  06/08/2003, 02/25/2011    2005 - normal 2012 - erosive esophagitis, 60 Fr dilation for dysphagia   Social History   Social History  . Marital  Status: Married    Spouse Name: N/A  . Number of Children: 2  . Years of Education: N/A   Occupational History  . CSR    Social History Main Topics  . Smoking status: Former Smoker    Types: Cigarettes    Quit date: 04/07/1989  . Smokeless tobacco: Never Used  . Alcohol Use: No  . Drug Use: No  . Sexual Activity: Not Asked   Other Topics Concern  . None   Social History Narrative   Married 2 daughters   Customer Service specialist   No caffeine   01/09/2015      Family History  Problem Relation Age of Onset  . Diabetes Mother   . Hypertension Mother   . Lung cancer Father   . Colon cancer Neg Hx   . Breast cancer Maternal Aunt   . Heart disease Maternal Grandmother   . CAD Maternal Grandmother   .  Atrial fibrillation Mother   . Lung cancer Maternal Uncle   . Diabetes Paternal Grandfather   . Ataxia Mother         Review of Systems As per history of present illness. Also positive for some night sweats urinary frequency and leakage, insomnia back pain and joint pains. All other review of systems are negative    Objective:   Physical Exam @BP  116/80 mmHg  Pulse 76  Ht 5\' 5"  (1.651 m)  Wt 234 lb 6 oz (106.312 kg)  BMI 39.00 kg/m2@  General:  Well-developed, well-nourished and in no acute distress - obese Eyes:  anicteric. ENT:   Mouth and posterior pharynx free of lesions.  Neck:   supple w/o thyromegaly or mass.  Lungs: Clear to auscultation bilaterally. Heart:  S1S2, no rubs, murmurs, gallops. Abdomen:  obese - soft, mildly tender RUQ and RLQ - RUQ worse with mm tension, no hepatosplenomegaly, hernia, or mass and BS+.  Rectal: Female staff present. Anoderm is normal. No rectal mass. Resting tone is slightly reduced.  Anoscopy is performed with chaperone present and demonstrates grade 2 internal hemorrhoids in all 3 positions.   Lymph:  no cervical or supraclavicular adenopathy. Extremities:   no edema, cyanosis or clubbing Skin   no rash. Neuro:  A&O x 3.  Psych:  appropriate mood and  Affect.   Data Reviewed: As per history of present illness regarding EGD 2012. Also had a normal EGD in 2005 for the workup for iron deficiency anemia. Colonoscopy negative then also. It was normal. I thought her iron deficiency anemia with previous menstrual blood loss      Assessment & Plan:  Gastroesophageal reflux disease with esophagitis  Dysphagia  Globus sensation  IBS (irritable bowel syndrome)  Fecal soiling due to fecal incontinence, suspect this is from hemorrhoids  Prolapsed internal hemorrhoids, grade 2 - Plan: hydrocortisone (ANUSOL-HC) 25 MG suppository  Omeprazole increase to 40 mg bid benefiber 1-2 tbsp/day for hemorrhoids HC suppositories for  hemorrhoids - ? Banding - introduced the procedure to her Screening colon at 49 05/2015 Stress likely a part of situation IF UGI sxs persist then Ba swallow vs EGD/dili  I appreciate the opportunity to care for this patient. CC: [place], DO

## 2015-01-11 ENCOUNTER — Encounter: Payer: Self-pay | Admitting: Internal Medicine

## 2015-02-12 ENCOUNTER — Other Ambulatory Visit: Payer: Self-pay | Admitting: Obstetrics and Gynecology

## 2015-02-12 DIAGNOSIS — N6001 Solitary cyst of right breast: Secondary | ICD-10-CM

## 2015-03-09 ENCOUNTER — Ambulatory Visit: Payer: BLUE CROSS/BLUE SHIELD | Admitting: Internal Medicine

## 2015-03-16 ENCOUNTER — Ambulatory Visit
Admission: RE | Admit: 2015-03-16 | Discharge: 2015-03-16 | Disposition: A | Discharge status: Home / Self Care | Payer: BLUE CROSS/BLUE SHIELD | Source: Ambulatory Visit | Attending: Obstetrics and Gynecology | Admitting: Obstetrics and Gynecology

## 2015-03-16 DIAGNOSIS — N6001 Solitary cyst of right breast: Secondary | ICD-10-CM

## 2015-05-21 ENCOUNTER — Ambulatory Visit: Payer: BLUE CROSS/BLUE SHIELD | Admitting: Internal Medicine

## 2015-05-22 ENCOUNTER — Other Ambulatory Visit: Payer: Self-pay

## 2015-05-22 MED ORDER — OMEPRAZOLE 40 MG PO CPDR: 40.0000 mg | DELAYED_RELEASE_CAPSULE | Freq: Two times a day (BID) | ORAL | Status: DC

## 2015-05-22 NOTE — Telephone Encounter (Signed)
rx sent in as requested 

## 2015-06-21 ENCOUNTER — Ambulatory Visit (INDEPENDENT_AMBULATORY_CARE_PROVIDER_SITE_OTHER): Payer: BLUE CROSS/BLUE SHIELD | Admitting: Internal Medicine

## 2015-06-21 ENCOUNTER — Encounter: Payer: Self-pay | Admitting: Internal Medicine

## 2015-06-21 VITALS — BP 120/80 | HR 72 | Ht 65.5 in | Wt 236.0 lb

## 2015-06-21 DIAGNOSIS — R1310 Dysphagia, unspecified: Secondary | ICD-10-CM

## 2015-06-21 DIAGNOSIS — K648 Other hemorrhoids: Secondary | ICD-10-CM | POA: Diagnosis not present

## 2015-06-21 DIAGNOSIS — R195 Other fecal abnormalities: Secondary | ICD-10-CM | POA: Diagnosis not present

## 2015-06-21 DIAGNOSIS — K641 Second degree hemorrhoids: Secondary | ICD-10-CM | POA: Insufficient documentation

## 2015-06-21 DIAGNOSIS — Z1211 Encounter for screening for malignant neoplasm of colon: Secondary | ICD-10-CM

## 2015-06-21 NOTE — Patient Instructions (Addendum)
  You have been scheduled for an endoscopy and colonoscopy. Please follow the written instructions given to you at your visit today. Please pick up your over the counter prep supplies at the pharmacy. If you use inhalers (even only as needed), please bring them with you on the day of your procedure. Your physician has requested that you go to www.startemmi.com and enter the access code given to you at your visit today. This web site gives a general overview about your procedure. However, you should still follow specific instructions given to you by our office regarding your preparation for the procedure.  Adjust your omeprazole as we discussed.  I appreciate the opportunity to care for you. Silvano Rusk, MD, [facility]

## 2015-06-21 NOTE — Progress Notes (Signed)
   Subjective:    Patient ID: Paige Johns, female    DOB: [birth year], 50 y.o.   MRN: [id number] Cc: dysphagia, colon cancer screening  HPI Paige Johns is here for follow-up. She is experiencing some dysphagia again. I had seen her a few months ago we tried increasing her omeprazole twice a day and that didn't solve her problems completely. The plan was for an EGD and dilation if that was the case that she had been helped by a dilation empirically in 2012. It's also time for colon cancer screening. She also has known prolapsed hemorrhoid she still having some mild problems with those and she is considering banding. She's had fecal soiling issues. Medications, allergies, past medical history, past surgical history, family history and social history are reviewed and updated in the EMR.   Review of Systems As above    Objective:   Physical Exam  BP 120/80 mmHg  Pulse 72  Ht 5' 5.5" (1.664 m)  Wt 236 lb (107.049 kg)  BMI 38.66 kg/m2 NAD     Assessment & Plan:   Encounter Diagnoses  Name Primary?  Marland Kitchen Dysphagia Yes  . Colon cancer screening   . Loose stools - likely from metformin   . Prolapsed internal hemorrhoids, grade 2 w/ fecal soiling    Reduce oemparzole to qd again Egd, dilation - helped in past Screening colonoscopy The risks and benefits as well as alternatives of endoscopic procedure(s) have been discussed and reviewed. All questions answered. The patient agrees to proceed.  Eventual likely hemorrhoid banding after studies  CC: [place], DO

## 2015-07-12 ENCOUNTER — Other Ambulatory Visit: Payer: Self-pay | Admitting: Obstetrics and Gynecology

## 2015-07-12 DIAGNOSIS — N6001 Solitary cyst of right breast: Secondary | ICD-10-CM

## 2015-08-06 ENCOUNTER — Encounter: Payer: BLUE CROSS/BLUE SHIELD | Admitting: Internal Medicine

## 2015-08-20 ENCOUNTER — Encounter: Payer: BLUE CROSS/BLUE SHIELD | Admitting: Internal Medicine

## 2015-08-21 ENCOUNTER — Ambulatory Visit
Admission: RE | Admit: 2015-08-21 | Discharge: 2015-08-21 | Disposition: A | Discharge status: Home / Self Care | Payer: BLUE CROSS/BLUE SHIELD | Source: Ambulatory Visit | Attending: Obstetrics and Gynecology | Admitting: Obstetrics and Gynecology

## 2015-08-21 DIAGNOSIS — N6001 Solitary cyst of right breast: Secondary | ICD-10-CM

## 2015-09-04 ENCOUNTER — Encounter: Payer: Self-pay | Admitting: Internal Medicine

## 2015-09-04 ENCOUNTER — Ambulatory Visit (AMBULATORY_SURGERY_CENTER): Payer: BLUE CROSS/BLUE SHIELD | Admitting: Internal Medicine

## 2015-09-04 VITALS — BP 104/65 | HR 65 | Temp 98.0°F | Resp 12 | Ht 65.5 in | Wt 236.0 lb

## 2015-09-04 DIAGNOSIS — R1310 Dysphagia, unspecified: Secondary | ICD-10-CM | POA: Diagnosis not present

## 2015-09-04 DIAGNOSIS — Z1211 Encounter for screening for malignant neoplasm of colon: Secondary | ICD-10-CM | POA: Diagnosis present

## 2015-09-04 MED ORDER — SODIUM CHLORIDE 0.9 % IV SOLN: 500.0000 mL | INTRAVENOUS | Status: DC

## 2015-09-04 NOTE — Patient Instructions (Addendum)
I dilated the esophagus to see if it helps the swallowing again.  No polyps or cancer.  We will contact you for a follow-up visit to band the hemorrhoids.  Next routine colonoscopy/screening test in 10 years - 2027  I appreciate the opportunity to care for you. Gatha Mayer, MD, FACG    YOU HAD AN ENDOSCOPIC PROCEDURE TODAY AT [facility]:   Refer to the procedure report that was given to you for any specific questions about what was found during the examination.  If the procedure report does not answer your questions, please call your gastroenterologist to clarify.  If you requested that your care partner not be given the details of your procedure findings, then the procedure report has been included in a sealed envelope for you to review at your convenience later.  YOU SHOULD EXPECT: Some feelings of bloating in the abdomen. Passage of more gas than usual.  Walking can help get rid of the air that was put into your GI tract during the procedure and reduce the bloating. If you had a lower endoscopy (such as a colonoscopy or flexible sigmoidoscopy) you may notice spotting of blood in your stool or on the toilet paper. If you underwent a bowel prep for your procedure, you may not have a normal bowel movement for a few days.  Please Note:  You might notice some irritation and congestion in your nose or some drainage.  This is from the oxygen used during your procedure.  There is no need for concern and it should clear up in a day or so.  SYMPTOMS TO REPORT IMMEDIATELY:   Following lower endoscopy (colonoscopy or flexible sigmoidoscopy):  Excessive amounts of blood in the stool  Significant tenderness or worsening of abdominal pains  Swelling of the abdomen that is new, acute  Fever of 100F or higher   Following upper endoscopy (EGD)  Vomiting of blood or coffee ground material  New chest pain or pain under the shoulder blades  Painful or persistently  difficult swallowing  New shortness of breath  Fever of 100F or higher  Black, tarry-looking stools  For urgent or emergent issues, a gastroenterologist can be reached at any hour by calling [phone].   DIET:   Drink plenty of fluids but you should avoid alcoholic beverages for 24 hours.  Please follow the dilatation diet the rest of the day.  ACTIVITY:  You should plan to take it easy for the rest of today and you should NOT DRIVE or use heavy machinery until tomorrow (because of the sedation medicines used during the test).    FOLLOW UP: Our staff will call the number listed on your records the next business day following your procedure to check on you and address any questions or concerns that you may have regarding the information given to you following your procedure. If we do not reach you, we will leave a message.  However, if you are feeling well and you are not experiencing any problems, there is no need to return our call.  We will assume that you have returned to your regular daily activities without incident.  If any biopsies were taken you will be contacted by phone or by letter within the next 1-3 weeks.  Please call us at [phone] if you have not heard about the biopsies in 3 weeks.    SIGNATURES/CONFIDENTIALITY: You and/or your care partner have signed paperwork which will be entered into your electronic medical  record.  These signatures attest to the fact that that the information above on your After Visit Summary has been reviewed and is understood.  Full responsibility of the confidentiality of this discharge information lies with you and/or your care-partner.   Handouts were given to your care partner on hemorrhoids, hemorrhoid banding information and esophageal dilatation. Your blood sugar was 108 in the recovery room. You may resume your current medications today. The 3rd floor nurse will call you with an appointment for hemorrhoids banding. Please call if  any questions or concerns.

## 2015-09-04 NOTE — Progress Notes (Signed)
I asked the pt how she felt before discharge.  She denied right arm or right shoulder pain.  She said she did feel better.  No complaints on discharge. maw

## 2015-09-04 NOTE — Op Note (Signed)
[place] Patient Name: Paige Johns Procedure Date: 09/04/2015 8:52 AM MRN: [id number] Endoscopist: Gatha Mayer , MD Age: 50 Referring MD:  Date of Birth: [birth year] Gender: Female Procedure:                Upper GI endoscopy Indications:              Dysphagia Medicines:                Propofol per Anesthesia, Monitored Anesthesia Care Procedure:                Pre-Anesthesia Assessment:                           - Prior to the procedure, a History and Physical                            was performed, and patient medications and                            allergies were reviewed. The patient's tolerance of                            previous anesthesia was also reviewed. The risks                            and benefits of the procedure and the sedation                            options and risks were discussed with the patient.                            All questions were answered, and informed consent                            was obtained. Prior Anticoagulants: The patient has                            taken no previous anticoagulant or antiplatelet                            agents. ASA Grade Assessment: III - A patient with                            severe systemic disease. After reviewing the risks                            and benefits, the patient was deemed in                            satisfactory condition to undergo the procedure.                           After obtaining informed consent, the endoscope was  passed under direct vision. Throughout the                            procedure, the patient's blood pressure, pulse, and                            oxygen saturations were monitored continuously. The                            Model GIF-HQ190 [id number]) scope was introduced                            through the mouth, and advanced to the second part                            of duodenum. The upper GI endoscopy  was                            accomplished without difficulty. The patient                            tolerated the procedure well. Scope In: Scope Out: Findings:                 No endoscopic abnormality was evident in the                            esophagus to explain the patient's complaint of                            dysphagia. It was decided, however, to proceed with                            dilation of the entire esophagus. The scope was                            withdrawn. Dilation was performed with a Maloney                            dilator with mild resistance at 79 Fr. No heme.                           The exam was otherwise without abnormality.                           The cardia and gastric fundus were normal on                            retroflexion. Complications:            No immediate complications. Estimated Blood Loss:     Estimated blood loss: none. Impression:               - No endoscopic esophageal abnormality to explain  patient's dysphagia. Esophagus dilated. Dilated.                           - The examination was otherwise normal.                           - No specimens collected. Recommendation:           - Patient has a contact number available for                            emergencies. The signs and symptoms of potential                            delayed complications were discussed with the                            patient. Return to normal activities tomorrow.                            Written discharge instructions were provided to the                            patient.                           - Clear liquids x 1 hour then soft foods rest of                            day. Start prior diet tomorrow.                           - Continue present medications.                           - See the other procedure note for documentation of                            additional recommendations. Gatha Mayer,  MD 09/04/2015 9:29:06 AM This report has been signed electronically. CC Letter to:             [employer]

## 2015-09-04 NOTE — Op Note (Signed)
[place] Patient Name: Paige Johns Procedure Date: 09/04/2015 8:52 AM MRN: [id number] Endoscopist: Gatha Mayer , MD Age: 50 Referring MD:  Date of Birth: [birth year] Gender: Female Procedure:                Colonoscopy Indications:              Screening for colorectal malignant neoplasm Medicines:                Propofol per Anesthesia, Monitored Anesthesia Care Procedure:                Pre-Anesthesia Assessment:                           - Prior to the procedure, a History and Physical                            was performed, and patient medications and                            allergies were reviewed. The patient's tolerance of                            previous anesthesia was also reviewed. The risks                            and benefits of the procedure and the sedation                            options and risks were discussed with the patient.                            All questions were answered, and informed consent                            was obtained. Prior Anticoagulants: The patient has                            taken no previous anticoagulant or antiplatelet                            agents. ASA Grade Assessment: III - A patient with                            severe systemic disease. After reviewing the risks                            and benefits, the patient was deemed in                            satisfactory condition to undergo the procedure.                           After obtaining informed consent, the colonoscope  was passed under direct vision. Throughout the                            procedure, the patient's blood pressure, pulse, and                            oxygen saturations were monitored continuously. The                            Model CF-HQ190L [id number]) scope was introduced                            through the anus and advanced to the the cecum,                            identified  by appendiceal orifice and ileocecal                            valve. The ileocecal valve, appendiceal orifice,                            and rectum were photographed. The quality of the                            bowel preparation was excellent. The colonoscopy                            was performed without difficulty. The patient                            tolerated the procedure well. The bowel preparation                            used was Miralax. Scope In: 9:10:17 AM Scope Out: 9:20:24 AM Scope Withdrawal Time: 0 hours 7 minutes 29 seconds  Total Procedure Duration: 0 hours 10 minutes 7 seconds  Findings:                 The perianal and digital rectal examinations were                            normal.                           The colon (entire examined portion) appeared normal.                           No additional abnormalities were found on                            retroflexion. Complications:            No immediate complications. Estimated blood loss:                            None. Estimated Blood Loss:  Estimated blood loss: none. Impression:               - The entire examined colon is normal.                           - No specimens collected.                           - She does have a history of prolapsed hemorrhoids                            not seen as expected on insufflated exam today Recommendation:           - Repeat colonoscopy in 10 years for screening                            purposes.                           - Patient has a contact number available for                            emergencies. The signs and symptoms of potential                            delayed complications were discussed with the                            patient. Return to normal activities tomorrow.                            Written discharge instructions were provided to the                            patient.                           - Resume previous diet.                            - Continue present medications.                           - Return to my office at appointment to be                            scheduled.                           - return for hemorrhoid banding and f/u dysphagia Gatha Mayer, MD 09/04/2015 9:32:26 AM This report has been signed electronically. CC Letter to:             [employer]

## 2015-09-04 NOTE — Progress Notes (Signed)
Called to room to assist during endoscopic procedure.  Patient ID and intended procedure confirmed with present staff. Received instructions for my participation in the procedure from the performing physician.  

## 2015-09-04 NOTE — Progress Notes (Signed)
When pt started waking up from her sleep, she was emotional and tearful.  I asked if she was in pain now and pt shook her head no.  She did report that her right arm hurt when the propofol was pulled in.  Dr. Carlean Purl in to speak to pt and her husband.  He said she might tell the CRNA that she needs liodcain mixed with propofol when she has it again.  Dr. Carlean Purl had pt laughing at herself passing flatus before her left her bay in the recovery room.  maw

## 2015-09-04 NOTE — Progress Notes (Signed)
Patient awakening,vss,report to rn 

## 2015-09-05 ENCOUNTER — Telehealth: Payer: Self-pay | Admitting: *Deleted

## 2015-09-05 NOTE — Telephone Encounter (Signed)
  Follow up Call-  Call back number 09/04/2015  Post procedure Call Back phone  # [phone]  Permission to leave phone message Yes     Patient questions:  Do you have a fever, pain , or abdominal swelling? No. Pain Score  0 *  Have you tolerated food without any problems? Yes.    Have you been able to return to your normal activities? Yes.    Do you have any questions about your discharge instructions: Diet   No. Medications  No. Follow up visit  No.  Do you have questions or concerns about your Care? No.  Actions: * If pain score is 4 or above: No action needed, pain <4.

## 2015-10-19 ENCOUNTER — Ambulatory Visit (INDEPENDENT_AMBULATORY_CARE_PROVIDER_SITE_OTHER): Payer: BLUE CROSS/BLUE SHIELD | Admitting: Internal Medicine

## 2015-10-19 ENCOUNTER — Encounter: Payer: Self-pay | Admitting: Internal Medicine

## 2015-10-19 VITALS — BP 134/82 | HR 85 | Ht 65.5 in | Wt 235.2 lb

## 2015-10-19 DIAGNOSIS — K648 Other hemorrhoids: Secondary | ICD-10-CM

## 2015-10-19 DIAGNOSIS — K641 Second degree hemorrhoids: Secondary | ICD-10-CM

## 2015-10-19 NOTE — Assessment & Plan Note (Addendum)
RP and RA banded - 3 bands (see note) benefiber 1 tbsp daily

## 2015-10-19 NOTE — Patient Instructions (Addendum)
HEMORRHOID BANDING PROCEDURE    FOLLOW-UP CARE   1. The procedure you have had should have been relatively painless since the banding of the area involved does not have nerve endings and there is no pain sensation.  The rubber band cuts off the blood supply to the hemorrhoid and the band may fall off as soon as 48 hours after the banding (the band may occasionally be seen in the toilet bowl following a bowel movement). You may notice a temporary feeling of fullness in the rectum which should respond adequately to plain Tylenol or Motrin.  2. Following the banding, avoid strenuous exercise that evening and resume full activity the next day.  A sitz bath (soaking in a warm tub) or bidet is soothing, and can be useful for cleansing the area after bowel movements.     3. To avoid constipation, take two tablespoons of natural wheat bran, natural oat bran, flax, Benefiber or any over the counter fiber supplement and increase your water intake to 7-8 glasses daily.    4. Unless you have been prescribed anorectal medication, do not put anything inside your rectum for two weeks: No suppositories, enemas, fingers, etc.  5. Occasionally, you may have more bleeding than usual after the banding procedure.  This is often from the untreated hemorrhoids rather than the treated one.  Don't be concerned if there is a tablespoon or so of blood.  If there is more blood than this, lie flat with your bottom higher than your head and apply an ice pack to the area. If the bleeding does not stop within a half an hour or if you feel faint, call our office at [phone] or go to the emergency room.  6. Problems are not common; however, if there is a substantial amount of bleeding, severe pain, chills, fever or difficulty passing urine (very rare) or other problems, you should call us at [phone] or report to the nearest emergency room.  7. Do not stay seated continuously for more than 2-3 hours for a day or two  after the procedure.  Tighten your buttock muscles 10-15 times every two hours and take 10-15 deep breaths every 1-2 hours.  Do not spend more than a few minutes on the toilet if you cannot empty your bowel; instead re-visit the toilet at a later time.    See how you feel in 6-8 weeks and call us if you need to be seen for additional banding.   Today we have given you a handout to read and follow on Benefiber, if it helps too much you can stop it.   I appreciate the opportunity to care for you. Silvano Rusk, MD, [facility]

## 2015-10-19 NOTE — Progress Notes (Signed)
Patient ID: Paige Johns, female   DOB: 04-17-1965, 50 y.o.   MRN: [id number]   Patient is here w/ sxs of fecal incontinence/soiling and anal itching w/ some hx rectal bleeding Recent colonoscopy negative  Anoscopy was performed with the patient in the left lateral decubitus position while a chaperone was present and revealed Gr 2 internal hemorrhoids all positions    PROCEDURE NOTE: The patient presents with symptomatic grade 2  hemorrhoids, requesting rubber band ligation of his/her hemorrhoidal disease.  All risks, benefits and alternative forms of therapy were described and informed consent was obtained.   The anorectum was pre-medicated with 0.125% NTG and 5% lidocaine The decision was made to band the RP, RA and LL internal hemorrhoid( after discussion of increased but rare complications with patient she preferred this approach), and the [place] was used to perform band ligation Digital anorectal examination was then performed to assure proper positioning of the band, and to adjust the banded tissue as required. I did not band LL as there was successful banding of RA and then thought had RP misfire but turned out to have an RP and then band between RA and RP so halted banding  The patient was discharged home without pain or other issues.  Dietary and behavioral recommendations were given and along with follow-up instructions.     The following adjunctive treatments were recommended:  Benefiber 1 tbsp each night  The patient will return as needed for  follow-up and possible additional banding as required. No complications were encountered and the patient tolerated the procedure well.  I appreciate the opportunity to care for this patient. Cc;MAHONEY,MARK, DO

## 2017-07-15 ENCOUNTER — Other Ambulatory Visit: Payer: Self-pay | Admitting: Obstetrics and Gynecology

## 2017-07-15 DIAGNOSIS — N644 Mastodynia: Secondary | ICD-10-CM

## 2017-07-21 ENCOUNTER — Other Ambulatory Visit: Payer: BLUE CROSS/BLUE SHIELD

## 2017-07-24 ENCOUNTER — Ambulatory Visit
Admission: RE | Admit: 2017-07-24 | Discharge: 2017-07-24 | Disposition: A | Discharge status: Home / Self Care | Payer: BLUE CROSS/BLUE SHIELD | Source: Ambulatory Visit | Attending: Obstetrics and Gynecology | Admitting: Obstetrics and Gynecology

## 2017-07-24 DIAGNOSIS — N644 Mastodynia: Secondary | ICD-10-CM

## 2017-09-23 ENCOUNTER — Other Ambulatory Visit: Payer: Self-pay | Admitting: Obstetrics and Gynecology

## 2017-09-23 DIAGNOSIS — Z1231 Encounter for screening mammogram for malignant neoplasm of breast: Secondary | ICD-10-CM

## 2017-10-15 ENCOUNTER — Ambulatory Visit
Admission: RE | Admit: 2017-10-15 | Discharge: 2017-10-15 | Disposition: A | Discharge status: Home / Self Care | Payer: BLUE CROSS/BLUE SHIELD | Source: Ambulatory Visit | Attending: Obstetrics and Gynecology | Admitting: Obstetrics and Gynecology

## 2017-10-15 DIAGNOSIS — Z1231 Encounter for screening mammogram for malignant neoplasm of breast: Secondary | ICD-10-CM

## 2017-10-27 ENCOUNTER — Ambulatory Visit: Payer: BLUE CROSS/BLUE SHIELD

## 2018-01-13 ENCOUNTER — Encounter: Payer: Self-pay | Admitting: Physician Assistant

## 2018-01-13 ENCOUNTER — Ambulatory Visit (INDEPENDENT_AMBULATORY_CARE_PROVIDER_SITE_OTHER): Payer: BLUE CROSS/BLUE SHIELD | Admitting: Physician Assistant

## 2018-01-13 VITALS — BP 130/80 | HR 68 | Ht 65.5 in | Wt 220.6 lb

## 2018-01-13 DIAGNOSIS — R0789 Other chest pain: Secondary | ICD-10-CM

## 2018-01-13 DIAGNOSIS — K219 Gastro-esophageal reflux disease without esophagitis: Secondary | ICD-10-CM

## 2018-01-13 MED ORDER — DEXLANSOPRAZOLE 60 MG PO CPDR: DELAYED_RELEASE_CAPSULE | ORAL | 3 refills | Status: DC

## 2018-01-13 NOTE — Patient Instructions (Signed)
We sent a prescription to [facility]. 1. Dexilant 60 mg Continue strict antireflux regimen.   Follow up with Dr. Carlean Purl or Amy Esterwood PA-C as needed.   Normal BMI (Body Mass Index- based on height and weight) is between 19 and 25. Your BMI today is Body mass index is 36.15 kg/m. Marland Kitchen Please consider follow up  regarding your BMI with your Primary Care Provider.

## 2018-01-13 NOTE — Progress Notes (Signed)
Subjective:    Patient ID: Paige Johns, female    DOB: 02/23/1966, 52 y.o.   MRN: [id number]  HPI Paige Johns is a very nice 52 year old white female, known to Dr. Carlean Purl who was last seen in our office in 2017.  She has history of GERD, IBS, hemorrhoids and is status post cholecystectomy. She underwent colonoscopy for screening in May 2017 which was a normal exam, and also had EGD at that same time which was negative but due to complaints of dysphasia she had dilation of the entire esophagus to 34 [place]. She comes in today to discuss her reflux symptoms and has questions regarding medications. She says that she has been on omeprazole 40 mg p.o. every morning over the past few years.  She says this helps but is not eliminating her symptoms at this point.  She is primarily having daytime symptoms with intermittent pressure and burning sensation in the subxiphoid area She will occasionally have a very vague sense of dysphasia She has no nocturnal symptoms. She does feel like her daytime symptoms are generally worse postprandially.  She tries to follow an antireflux regimen most of the time. She has no complaints of abdominal discomfort. She had an episode at work about 1-1/2 weeks ago with the subxiphoid chest discomfort which she says she has frequently but then also felt presyncopal while standing.  She had ER evaluation and was told that her cardiac work-up was negative, and the presyncopal sensation was felt secondary to vertigo which she has had in the past.  She now has a prescription for meclizine which she used for a few days.  The vertigo has not recurred. 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  She says she wanted to come in before that time.  Review of Systems Pertinent positive and negative  review of systems were noted in the above HPI section.  All other review of systems was otherwise negative.  Outpatient Encounter Medications as of 01/13/2018  Medication Sig  . ALPRAZolam (XANAX) 0.25 MG tablet Take 1 tablet by mouth as needed.  Marland Kitchen MINIVELLE 0.1 MG/24HR patch Place 1 patch onto the skin 2 (two) times a week.  . Omega-3 Fatty Acids (FISH OIL) 1200 MG CAPS Take by mouth.  . pantoprazole (PROTONIX) 40 MG tablet Take 40 mg by mouth daily.  . sitaGLIPtin-metformin (JANUMET) 50-1000 MG tablet Take 1 tablet by mouth 2 (two) times daily with a meal.  . thiamine (VITAMIN B-1) 100 MG tablet Take 100 mg by mouth every other day.  Marland Kitchen tiZANidine (ZANAFLEX) 4 MG tablet Take 4 mg by mouth as needed for muscle spasms.  . vitamin B-12 (CYANOCOBALAMIN) 1000 MCG tablet Take 1,000 mcg by mouth every other day.  . zolpidem (AMBIEN) 10 MG tablet Take 10 mg by mouth at bedtime as needed.    Marland Kitchen dexlansoprazole (DEXILANT) 60 MG capsule Take 1 capsule by mouth every morning.  . [DISCONTINUED] Coenzyme Q10 (COQ-10 PO) Take 1 tablet by mouth 2 (two) times a week. With simvastatin  . [DISCONTINUED] cyclobenzaprine (FLEXERIL) 10 MG tablet Take 1 tablet by mouth At bedtime as needed.  . [DISCONTINUED] hydrocortisone (ANUSOL-HC) 25 MG suppository Place 1 suppository (25 mg total) rectally at bedtime. For 7 nights then prn (Patient not taking: Reported on 01/13/2018)  . [DISCONTINUED]  ibuprofen (ADVIL,MOTRIN) 200 MG tablet Take 1-2 tablets as needed for headaches   . [DISCONTINUED] loperamide (IMODIUM A-D) 2 MG tablet Take 2 mg by mouth as needed.    . [DISCONTINUED] loratadine (CLARITIN) 10 MG tablet Take 10 mg by mouth daily.    . [DISCONTINUED] metFORMIN (GLUCOPHAGE) 500 MG tablet Take 500 mg by mouth 3 (three) times daily.   . [DISCONTINUED] omeprazole (PRILOSEC) 40 MG capsule Take 1 capsule (40 mg total) by mouth 2 (two) times daily before a meal. 30 mins before breakfast and supper (Patient not taking:  Reported on 01/13/2018)  . [DISCONTINUED] simvastatin (ZOCOR) 20 MG tablet Take 1 tablet by mouth 2 (two) times a week. With CoQ10  . [DISCONTINUED] venlafaxine XR (EFFEXOR-XR) 150 MG 24 hr capsule Take 1 capsule by mouth daily.   Facility-Administered Encounter Medications as of 01/13/2018  Medication  . 0.9 %  sodium chloride infusion   Allergies  Allergen Reactions  . Oxycodone-Acetaminophen Rash    PERCOCET   Patient Active Problem List   Diagnosis Date Noted  . Prolapsed internal hemorrhoids, grade 2 w/ fecal soiling 06/21/2015  . Loose stools - likely from metformin 06/21/2015  . OBESITY 10/24/2009  . GERD 10/24/2009  . IRRITABLE BOWEL SYNDROME DIARRHEA PREDOMINANT 05/12/2007  . Dysphagia 05/12/2007   Social History   Socioeconomic History  . Marital status: Married    Spouse name: Not on file  . Number of children: 2  . Years of education: Not on file  . Highest education level: Not on file  Occupational History  . Occupation: CSR    Employer: [employer]  Social Needs  . Financial resource strain: Not on file  . Food insecurity:    Worry: Not on file    Inability: Not on file  . Transportation needs:    Medical: Not on file    Non-medical: Not on file  Tobacco Use  . Smoking status: Former Smoker    Types: Cigarettes    Last attempt to quit: 04/07/1989    Years since quitting: 28.7  . Smokeless tobacco: Never Used  Substance and Sexual Activity  . Alcohol use: No    Alcohol/week: 0.0 standard drinks  . Drug use: No  . Sexual activity: Not on file  Lifestyle  . Physical activity:    Days per week: Not on file    Minutes per session: Not on file  . Stress: Not on file  Relationships  . Social connections:    Talks on phone: Not on file    Gets together: Not on file    Attends religious service: Not on file    Active member of club or organization: Not on file    Attends meetings of clubs or organizations: Not on file    Relationship status: Not on  file  . Intimate partner violence:    Fear of current or ex partner: Not on file    Emotionally abused: Not on file    Physically abused: Not on file    Forced sexual activity: Not on file  Other Topics Concern  . Not on file  Social History Narrative   Married 2 daughters   Customer Service specialist   No caffeine   01/09/2015    Ms. Martes's family history includes Ataxia in her mother; Atrial fibrillation in her mother; Breast cancer in her maternal aunt; CAD in her maternal grandmother; Diabetes in her mother and paternal grandfather; Heart disease in her maternal grandmother; Hypertension in  her mother; Lung cancer in her father and maternal uncle.      Objective:    Vitals:   01/13/18 0827  BP: 130/80  Pulse: 68  SpO2: 98%    Physical Exam; well-developed older white female in no acute distress, pleasant, BMI 36.1.  HEENT ;nontraumatic normocephalic EOMI PERRLA sclera anicteric oral mucosa moist, Cardiovascular; regular rate and rhythm with S1-S2 no murmur rub or gallop, Pulmonary ;clear bilaterally, Abdomen ;obese, soft nontender nondistended bowel sounds are active there is no palpable mass or hepatosplenomegaly  Rectal; exam not done, Extremities ;no clubbing cyanosis or edema skin warm dry,Neuro psych ;alert and oriented, grossly nonfocal mood and affect appropriate       Assessment & Plan:   #52 year old white female with history of chronic GERD currently poorly controlled daytime symptoms with intermittent subxiphoid discomfort and burning on omeprazole 40 mg every morning  Rule out mild esophagitis, I am also suspicious that some of this may be stress-induced  #2 cancer surveillance-up-to-date with normal colonoscopy May 2017 #3 status post cholecystectomy #4.  IBS #5.  Obesity/morbid BMI 36  Plan; we discussed tightening up on her antireflux regimen, she was given a copy of the diet. We also discussed n.p.o. for 3 hours prior to bedtime and elevation of her  back 45 degrees while sleeping which may also help her daytime symptoms. We will give her a trial of Dexilant 60 mg p.o. every morning which she had taken several years ago and says worked really well.  Prescription was sent today. She will follow-up with Dr. Carlean Purl or myself in 1 year or sooner as needed. Kaidyn Javid S Kamyrah Feeser PA-C 01/13/2018   Cc: Emelda Fear, DO

## 2019-08-29 ENCOUNTER — Other Ambulatory Visit: Payer: Self-pay | Admitting: Obstetrics and Gynecology

## 2019-08-29 DIAGNOSIS — R928 Other abnormal and inconclusive findings on diagnostic imaging of breast: Secondary | ICD-10-CM

## 2019-09-06 ENCOUNTER — Ambulatory Visit
Admission: RE | Admit: 2019-09-06 | Discharge: 2019-09-06 | Disposition: A | Discharge status: Home / Self Care | Payer: BC Managed Care – PPO | Source: Ambulatory Visit | Attending: Obstetrics and Gynecology | Admitting: Obstetrics and Gynecology

## 2019-09-06 ENCOUNTER — Other Ambulatory Visit: Payer: Self-pay | Admitting: Obstetrics and Gynecology

## 2019-09-06 ENCOUNTER — Other Ambulatory Visit: Payer: Self-pay

## 2019-09-06 DIAGNOSIS — N6310 Unspecified lump in the right breast, unspecified quadrant: Secondary | ICD-10-CM

## 2019-09-06 DIAGNOSIS — R928 Other abnormal and inconclusive findings on diagnostic imaging of breast: Secondary | ICD-10-CM

## 2019-09-07 ENCOUNTER — Other Ambulatory Visit: Payer: BLUE CROSS/BLUE SHIELD

## 2019-09-14 ENCOUNTER — Other Ambulatory Visit: Payer: BLUE CROSS/BLUE SHIELD

## 2019-10-07 ENCOUNTER — Encounter: Payer: Self-pay | Admitting: Counselor

## 2019-11-29 ENCOUNTER — Encounter: Payer: Self-pay | Admitting: Counselor

## 2019-11-29 ENCOUNTER — Ambulatory Visit (INDEPENDENT_AMBULATORY_CARE_PROVIDER_SITE_OTHER): Payer: BC Managed Care – PPO | Admitting: Counselor

## 2019-11-29 ENCOUNTER — Ambulatory Visit: Payer: BC Managed Care – PPO

## 2019-11-29 ENCOUNTER — Other Ambulatory Visit: Payer: Self-pay

## 2019-11-29 DIAGNOSIS — F09 Unspecified mental disorder due to known physiological condition: Secondary | ICD-10-CM

## 2019-11-29 DIAGNOSIS — R519 Headache, unspecified: Secondary | ICD-10-CM

## 2019-11-29 DIAGNOSIS — G4700 Insomnia, unspecified: Secondary | ICD-10-CM

## 2019-11-29 DIAGNOSIS — G8929 Other chronic pain: Secondary | ICD-10-CM

## 2019-11-29 NOTE — Progress Notes (Signed)
   Psychometrist Note   Cognitive testing was administered to Paige Johns by Lamar Benes, B.S. (Technician) under the supervision of Alphonzo Severance, Psy.D., ABN. [name] was able to tolerate all test procedures. Dr. Nicole Kindred met with the patient as needed to manage any emotional reactions to the testing procedures. Rest breaks were offered.    The battery of tests administered was selected by Dr. Nicole Kindred with consideration to the patient's current level of functioning, the nature of her symptoms, emotional and behavioral responses during the interview, level of literacy, observed level of motivation/effort, and the nature of the referral question. This battery was communicated to the psychometrist. Communication between Dr. Nicole Kindred and the psychometrist was ongoing throughout the evaluation and Dr. Nicole Kindred was immediately accessible at all times. Dr. Nicole Kindred provided supervision to the technician on the date of this service, to the extent necessary to assure the quality of all services provided.    [name] will return in approximately one week for an interactive feedback session with Dr. Nicole Kindred, at which time test performance, clinical impressions, and treatment recommendations will be reviewed in detail. The patient understands she can contact our office should she require our assistance before this time.   A total of 125 minutes of billable time were spent with Paige Johns by the technician, including test administration and scoring time. Billing for these services is reflected in Dr. Les Pou note.   This note reflects time spent with the psychometrician and does not include test scores, clinical history, or any interpretations made by Dr. Nicole Kindred. The full report will follow in a separate note.
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
## 2019-11-30 NOTE — Progress Notes (Signed)
[facility]  Patient Name: Paige Johns MRN: [id number] Date of Birth: December 07, 1965 Age: 54 y.o. Education: 14 years  Measurement properties of test scores: IQ, Index, and Standard Scores (SS): Mean = 100; Standard Deviation = 15 Scaled Scores (Ss): Mean = 10; Standard Deviation = 3 Z scores (Z): Mean = 0; Standard Deviation = 1 T scores (T); Mean = 50; Standard Deviation = 10  TEST SCORES:    Note: This summary of test scores accompanies the interpretive report and should not be interpreted by unqualified individuals or in isolation without reference to the report. Test scores are relative to age, gender, and educational history as available and appropriate.   Performance Validity        ACS: Raw  Descriptor      Word Choice 49 Within Expectation       Raw  Descriptor  The Dot Counting Test: 13 Within Expectation      Embedded Measures: Raw  Descriptor      WAIS-IV Reliable Digit Span 7 Within Expectation      WAIS-IV Reliable Digit Span Revised 11 Below Expectation      Mental Status Screening     Total Score Descriptor  MoCA 25 Normal      Expected Functioning        Wide Range Achievement Test: Standard/Scaled Score Percentile      Word Reading 95 37      [employer]olds Intellectual Screening Test Standard/T-score Percentile      Guess What 52 58      Odd Item Out 61 86  RIST Index 112 79      Attention/Processing Speed        Wechsler Adult Intelligence Scale - IV: Standard/Scaled Score Percentile  Working Memory Index 95 37      Digit Span 7 16          Digit Span Forward 7 16          Digit Span Backward 7 16          Digit Span Sequencing 8 25      Arithmetic 11 63  Processing Speed Index 100 50      Symbol Search 11 63      Coding 9 37      Language        Neuropsychological Assessment Battery (Language Module, Form 1): T-score Percentile      Naming   (31) 55 69      Verbal Fluency:  T Score Percentile       Controlled Oral Word Association (F-A-S) 43 25      Semantic Fluency (Animals) 42 21      Memory:        Neuropsychological Assessment Battery (Memory Module, Form 1): T-score/Standard Score Percentile  Memory Index (MEM): 88 21      List Learning           List A Immediate Recall   (5 , 8 , 9) 44 27         List B Immediate Recall   (3) 38 12         List A Short Delayed Recall   (8) 50 50         List A Long Delayed Recall   (8) 50 50         List A Percent Retention   (100 %) --- 54         List A  Long Delayed Yes/No Recognition Hits   (12) --- 82         List A Long Delayed Yes/No Recognition False Alarms   (1) --- 66         List A Recognition Discriminability Index --- 79      Shape Learning           Immediate Recognition   (6 , 5 , 7) 54 66         Delayed Recognition   (6) 49 46         Percent Retention   (86 %) --- 31         Delayed Forced-Choice Recognition Hits   (9) --- 86         Delayed Forced-Choice Recognition False Alarms   (0) --- 73         Delayed Forced-Choice Recognition Discriminability --- 88     Story Learning           Immediate Recall   (23, 28) 37 9         Delayed Recall   (30) 43 25         Percent Retention   (107 %) --- 82      Daily Living Memory            Immediate Recall   (24, 16) 42 21          Delayed Recall   (9, 3) 36 8          Percent Retention (71 %) --- 4          Recognition Hits   (10) --- 79      Visuospatial/Constructional Functioning        Neuropsychological Assessment Battery (Visuospatial Module) T-score Percentile      Visual Discrimination 60 84      Design Construction 53 62      Executive Functioning        Modified Wisconsin Card Sorting Test (MWCST): Standard/T-Score Percentile      Number of Categories Correct 55 69      Number of Perseverative Errors 45 31      Number of Total Errors 49 46      Percent Perseverative Errors 42 21  Executive Function Composite 100 50      Trail Making Test: T-Score  Percentile      Part A 55 69      Part B 51 54      Clock Drawing Raw Score Descriptor      Command 9 WNL      Rating Scales         Raw Score Descriptor  Patient Health Questionnaire - 9 13 Moderate  GAD-7 12 Minimal      Clinical Dementia Rating Raw Score Descriptor      Sum of Boxes 0.5 Mild Cognitive Impairment      Global Score 0.5 MCI    Loral Campi V. Nicole Kindred PsyD, [place] Clinical Neuropsychologist

## 2019-12-06 ENCOUNTER — Ambulatory Visit (INDEPENDENT_AMBULATORY_CARE_PROVIDER_SITE_OTHER): Payer: BC Managed Care – PPO | Admitting: Counselor

## 2019-12-06 ENCOUNTER — Telehealth: Payer: Self-pay | Admitting: Counselor

## 2019-12-06 ENCOUNTER — Other Ambulatory Visit: Payer: Self-pay

## 2019-12-06 ENCOUNTER — Encounter: Payer: Self-pay | Admitting: Counselor

## 2019-12-06 DIAGNOSIS — R4189 Other symptoms and signs involving cognitive functions and awareness: Secondary | ICD-10-CM | POA: Diagnosis not present

## 2019-12-06 DIAGNOSIS — F4322 Adjustment disorder with anxiety: Secondary | ICD-10-CM | POA: Diagnosis not present

## 2019-12-06 NOTE — Progress Notes (Signed)
[facility]  Patient Name: Paige Johns MRN: [id number] Date of Birth: October 18, 1965 Age: 55 y.o. Education: 75 years  Clinical Impressions and Recommendations  Paige Johns is a 54 y.o., right-hand dominant, married woman with a history of DMII, hyperlipidemia, insomnia, and depressive disorder. She has a subjective sense of being "overwhelmed and scattered" keeping up with her life over the past year but largely denied any significant affective issues apart from stress. She has no significant functional impairment. She is still working and reported that she did have some difficulties that resulted in her work being decreased, although that is because she was being asked to do more than other people, so it's not clear that it was because of cognitive shortcomings. She had an MRI, the images of which were not available, but the report mentions only mild bilateral leukoaraiosis.   Performance on neuropsychological testing was marginally less than expected with respect to memory measures, although she nevertheless performed at a low average level on memory measures overall, mitigating concerns about any frank impairment. Inspection of subtest scores shows difficulties with performance consistency as compared to a pattern concerning for memory storage problems, which may be on the basis of executive control difficulties. She did well in all other areas, including on challenging measures in other domains involving memory. Processing speed and working memory presented at an average level. Visuospatial and constructional functioning were good. She did report moderate levels of anxiety symptoms including feeling nervous, anxious, and on edge, not being able to stop or control worrying, and trouble relaxing more than half the days over the past two weeks. Her CDR is normal, with the exception of minor memory problems, with the Sum of Box score falling at a 0.5,  which is questionable cognitive impairment.    Paige Johns day-to-day problems are likely on the basis of executive control difficulties, which may be occasioned by anxiety, pain, overfocus on cognitive performance, insomnia, and stress. Suspicion for an underlying condition is low in this 54 year old patient with no more than mild cognitive difficulties but re-evaluation in 12 - 18 months could be considered to make sure there is no decline. Would recommend considering a brain-sparing medication for treatment of anxiety and/or behavioral strategies such as mindfulness, behavioral activation, and the like. I will provide her with sleep hygiene recommendations that may be helpful for her insomnia. She is already exercising and other healthy lifestyle changes, such as eating a nutritious diet and management of cerebrovascular risk factors, is also recommended.   Diagnostic Impressions: Adjustment disorder with anxiety Subjective cognitive impairment  Test Findings  Test scores are summarized in additional documentation associated with this encounter. Test scores are relative to age, gender, and educational history as available and appropriate. There were no concerns about performance validity as all findings fell within normal expectations.   General Intellectual Functioning/Achievement:  Performance on single word reading was average whereas performance on the RIST index was high average. The verbally mediated subtest of the RIST generated an average score, whereas performance was high average on the more visually oriented subtest. The pattern of findings suggests marginally better visual as compared to verbal capacities.   Attention and Processing Efficiency: Performance on indicators of attention was normal with an average overall score on the Working Memory Index of the WAIS-IV. Low average performance was obtained on digit repetition forward and backward, whereas digit resequencing in ascending  order was average. Mental solving of arithmetical word problems was average.  Processing speed fell at an average level on the Processing Speed Index of the WAIS-IV, with comparable average range scores on a symbol-matching to sample test emphasizing efficient visual scanning and visual matching and on timed number-symbol coding.   Language: Language findings were within normal limits with errorless visual object confrontation naming. Generation of words in response to letter cues and the category prompt "animals" was low average.   Visuospatial Function: Performance on visuospatial and constructional measures was good, with high average performance identifying a target design from amongst competing choices. Construction of 2-dimensional target designs from a set of small plastic tile pieces was average.   Learning and Memory: Performance on measures of memory and learning fell at a reasonable, low average level. Nevertheless, that is psychometrically less than expected on the basis of this patient's RIST index. The number of low subtest scores is marginally less than expected and the pattern shows difficulties with performance consistency that do not appear to be particularly meaningful from a memory perspective but rather, may suggest some executive control difficulties.   In the verbal realm, performance was entirely normal and within the average range on word list learning and delayed free recall. Short story learning, by contrast was low average and a bit weak on immediate recall but retention across time was good with very good retention and low average delayed recall. Daily living memory was low average on immediate recall and unusually low on delayed recall, this time with weaker retention of information across time. She did recognize the information at a reasonable level when contained amongst distractor alternate items, with a high average recognition score.   In the visual realm, immediate  recognition of a series of designs that are difficult to verbally encode was average and delayed recall was also average, with comparable average range retention of information across time. Recognition discriminability for the designs using a yes/no format was errorless.   Executive Functions: Performance on measures of executive abilities was good with an average score on the [place] Composite of the Modified [employer] and average alternating sequencing of numbers and letters of the alphabet. Generation of words in response to the letters F-A-S was low average. Clock drawing was good with reasonable formation of the face, numbers, and hand placement.   Rating Scale(s): Ms. Eimer reported moderate levels of anxiety symptoms and depressive symptoms, although the depressive symptoms she reported were by and large somatic in nature and she denied much in the way of subjective depressed mood. She may have a tendency to experience symptoms somatically or that may be a false positive.   Viviano Simas Nicole Kindred PsyD, [place] Clinical Neuropsychologist

## 2019-12-06 NOTE — Telephone Encounter (Signed)
Received message from [facility] that they cannot see patient because she is in [place]. I called her and suggested she contact her primary care office for a referral. I also suggested she try [email or web address], which has a fairly comprehensive listing of providers.

## 2019-12-06 NOTE — Progress Notes (Signed)
[facility]  Telemedicine statement:  I discussed the limitations of neuropsychological care via telemedicine and the availability of in person appointments. The patient expressed understanding and agreed to proceed. The patient was verified with two identifiers.  The visit modality was: telephonic The patient location was: home The provider location was: office  The following individuals participated: Paige Johns  Feedback Note: I met with Paige Johns to review the findings resulting from her neuropsychological evaluation. Since the last appointment, she has been busy. Unfortunately, her husband continues to struggle with debilitating headaches. They had birthday parties and church confirmations this weekend. Her symptoms remain unchanged. Time was spent reviewing the impressions and recommendations that are detailed in the evaluation report. We discussed impression of less than expected memory performance, likely on the basis of executive control issues. She continues to have insomnia, which is likely stress related (she wakes up thinking about things, generally of a negative character). She was open to and accepted a referral for psychotherapy. I took time to explain the findings and answer all the patient's questions. I encouraged Ms. Tippin to contact me should she have any further questions or if further follow up is desired.   Current Medications and Medical History   Current Outpatient Medications  Medication Sig Dispense Refill  . ALPRAZolam (XANAX) 0.25 MG tablet Take 1 tablet by mouth as needed.    Marland Kitchen dexlansoprazole (DEXILANT) 60 MG capsule Take 1 capsule by mouth every morning. 90 capsule 3  . MINIVELLE 0.1 MG/24HR patch Place 1 patch onto the skin 2 (two) times a week.    . Omega-3 Fatty Acids (FISH OIL) 1200 MG CAPS Take by mouth.    . pantoprazole (PROTONIX) 40 MG tablet Take 40 mg by mouth daily.    . sitaGLIPtin-metformin  (JANUMET) 50-1000 MG tablet Take 1 tablet by mouth 2 (two) times daily with a meal.    . thiamine (VITAMIN B-1) 100 MG tablet Take 100 mg by mouth every other day.    Marland Kitchen tiZANidine (ZANAFLEX) 4 MG tablet Take 4 mg by mouth as needed for muscle spasms.    . vitamin B-12 (CYANOCOBALAMIN) 1000 MCG tablet Take 1,000 mcg by mouth every other day.    . zolpidem (AMBIEN) 10 MG tablet Take 10 mg by mouth at bedtime as needed.       Current Facility-Administered Medications  Medication Dose Route Frequency Provider Last Rate Last Admin  . 0.9 %  sodium chloride infusion  500 mL Intravenous Continuous Gatha Mayer, MD        Patient Active Problem List   Diagnosis Date Noted  . Prolapsed internal hemorrhoids, grade 2 w/ fecal soiling 06/21/2015  . Loose stools - likely from metformin 06/21/2015  . OBESITY 10/24/2009  . GERD 10/24/2009  . IRRITABLE BOWEL SYNDROME DIARRHEA PREDOMINANT 05/12/2007  . Dysphagia 05/12/2007    Mental Status and Behavioral Observations  Paige Johns was available at the prespecified time for this telephonic appointment and was alert and generally oriented (orientation not formally assessed). Speech was normal in rate, rhythm, volume, and prosody. Self-reported mood was "good" and affect as assessed by vocal quality was mainly euthymic. Thought process was logical and goal-oriented and thought content was appropriate. There were no safety concerns identified at today's encounter, such as thoughts of harming self or others.   Plan  Feedback provided regarding the patient's neuropsychological evaluation. She has very mild executive control type problems likely related to reversible causes.  She has been referred for psychotherapy, will work on sleep hygiene, and I also will provide her with recommendations for a MIND diet and exercise (she is already doing some exercise). Paige Johns was encouraged to contact me if any questions arise or if further follow up is  desired.   Viviano Simas Nicole Kindred, PsyD, AB[employer] Clinical Neuropsychologist  Service(s) Provided at This Encounter: 39 minutes [phone]; Psychotherapy with patient/family)

## 2019-12-06 NOTE — Patient Instructions (Signed)
We discussed your neuropsychological test performance, which was overall benign and suggests good ability but you did have marginally less than expected performance on memory measures. In your case, however, the pattern is not concerning for a primary memory problem and suggests that you are mainly having difficulties with executive control.   Executive control is a higher order cognitive ability involved in regulating other cognitive resources. Much like the conductor of an orchestra coordinates multiple instruments to make music, executive capacities coordinate other lower-order skills (e.g., movement, language, attention) to form complex human behaviors. Individuals with executive control problems are often capable of doing most of the things they did before they were having problems, but they may not do so as effortlessly, efficiently, and consistently. These difficulties often manifest as problems tracking information, multitasking, and paying attention. Executive control problems often result in cognitive inefficiency and can present as "memory problems," because they decrease encoding and spontaneous retrieval of information.   In your case, I think that your executive control problems are caused by stress, insomnia, pain related to your back problems, and perhaps by overfocus on cognition. We discussed treatments for these issues including a referral for psychotherapy, which you accepted.   There is now good quality evidence from at least one large scale study that a modified mediterranean diet may help slow cognitive decline. This is known as the "MIND" diet. The Mind diet is not so much a specific diet as it is a set of recommendations for things that you should and should not eat.   Foods that are ENCOURAGED on the MIND Diet:  Green, leafy vegetables: Aim for six or more servings per week. This includes kale, spinach, cooked greens and salads.  All other vegetables: Try to eat another vegetable  in addition to the green leafy vegetables at least once a day. It is best to choose non-starchy vegetables because they have a lot of nutrients with a low number of calories.  Berries: Eat berries at least twice a week. There is a plethora of research on strawberries, and other berries such as blueberries, raspberries and blackberries have also been found to have antioxidant and brain health benefits.  Nuts: Try to get five servings of nuts or more each week. The creators of the [place] don't specify what kind of nuts to consume, but it is probably best to vary the type of nuts you eat to obtain a variety of nutrients. Peanuts are a legume and do not fall into this category.  Olive oil: Use olive oil as your main cooking oil. There may be other heart-healthy alternatives such as algae oil, though there is not yet sufficient research upon which to base a formal recommendation.  Whole grains: Aim for at least three servings daily. Choose minimally processed grains like oatmeal, quinoa, brown rice, whole-wheat pasta and 100% whole-wheat bread.  Fish: Eat fish at least once a week. It is best to choose fatty fish like salmon, sardines, trout, tuna and mackerel for their high amounts of omega-3 fatty acids.  Beans: Include beans in at least four meals every week. This includes all beans, lentils and soybeans.  Poultry: Try to eat chicken or [place] at least twice a week. Note that fried chicken is not encouraged on the MIND diet.  Wine: Aim for no more than one glass of alcohol daily. Both red and white wine may benefit the brain. However, much research has focused on the red wine compound resveratrol, which may help protect against Alzheimer's  disease.  Foods that are DISCOURAGED on the MIND Diet: Butter and margarine: Try to eat less than 1 tablespoon (about 14 grams) daily. Instead, try using olive oil as your primary cooking fat, and dipping your bread in olive oil with herbs.  Cheese: The MIND diet  recommends limiting your cheese consumption to less than once per week.  Red meat: Aim for no more than three servings each week. This includes all beef, pork, lamb and products made from these meats.  [place] food: The MIND diet highly discourages fried food, especially the kind from fast-food restaurants. Limit your consumption to less than once per week.  Pastries and sweets: This includes most of the processed junk food and desserts you can think of. Ice cream, cookies, brownies, snack cakes, donuts, candy and more. Try to limit these to no more than four times a week.  Exercise is one of the best medicines for promoting health and maintaining cognitive fitness at all stages in life. Exercise probably has the largest documented effect on brain health and performance of any lifestyle intervention. Studies have shown that even previously sedentary individuals who start exercising as late as age 38 show a significant survival benefit as compared to their non-exercising peers. In the [place], the current guidelines are for 30 minutes of moderate exercise per day, but increasing your activity level less than that may also be helpful. You do not have to get your 30 minutes of exercise in one shot and exercising for short periods of time spread throughout the day can be helpful. Go for several walks, learn to dance, or do something else you enjoy that gets your body moving. Of course, if you have an underlying medical condition or there is any question about whether it is safe for you to exercise, you should consult a medical treatment provider prior to beginning exercise.   Avoid overfocusing on cognitive performance. Memory and cognition are notoriously fallible and if you are looking for cognitive problems, you are bound to find them. Once someone gets worried about their memory and thinking, they may overfocus on how they are doing day-to-day, and then when normal day-to-day cognitive errors are made,  this becomes a cause for more concern. This concern and anxiety then decreases focus from the task at hand, reducing concentration, causing more cognitive problems, and creating a vicious cycle. Rather than critiquing your performance, I would encourage you to remain present minded and focus on the task at hand. Perhaps most importantly, have reasonable expectations for yourself.  Healthy people forget things, lose focus, and do not perform 100% correctly all the time. Some cognitive errors are normal and are not necessarily a sign that there is something wrong with your brain.   Of course, if you feel that your issues continue to worsen or wish for reevaluation in 1 to 2 years, that would be reasonable. You do not need to come back for reevaluation if you do not feel the need.

## 2020-02-09 ENCOUNTER — Ambulatory Visit: Payer: BC Managed Care – PPO | Admitting: Physician Assistant

## 2020-03-13 ENCOUNTER — Other Ambulatory Visit: Payer: Self-pay

## 2020-03-13 ENCOUNTER — Encounter: Payer: Self-pay | Admitting: Dermatology

## 2020-03-13 ENCOUNTER — Ambulatory Visit (INDEPENDENT_AMBULATORY_CARE_PROVIDER_SITE_OTHER): Payer: BC Managed Care – PPO | Admitting: Dermatology

## 2020-03-13 DIAGNOSIS — Z1283 Encounter for screening for malignant neoplasm of skin: Secondary | ICD-10-CM

## 2020-03-13 DIAGNOSIS — L821 Other seborrheic keratosis: Secondary | ICD-10-CM

## 2020-03-13 DIAGNOSIS — L729 Follicular cyst of the skin and subcutaneous tissue, unspecified: Secondary | ICD-10-CM

## 2020-03-13 NOTE — Patient Instructions (Addendum)
Routine follow-up visit on Paige Johns date of birth 1965-10-19. Known previous history of skin cancer. Recent new spots noted on the left side and the right forehead.

## 2020-03-20 ENCOUNTER — Encounter: Payer: Self-pay | Admitting: Dermatology

## 2020-03-20 NOTE — Progress Notes (Signed)
   Follow-Up Visit   Subjective  Paige Johns is a 54 y.o. female who presents for the following: Annual Exam (right forehead- x weeks- no itch no bleed, spot on back ? cyst, left side- 2 spots- "dark").  Multiple skin growths, some possibly new Location:  Duration:  Quality:  Associated Signs/Symptoms: Modifying Factors:  Severity:  Timing: Context:   Objective  Well appearing patient in no apparent distress; mood and affect are within normal limits. Objective  Chest - Medial [facility]):  Skin exam clear; no history of skin cancer or atypical moles  Objective  Right Upper Back: Tan textured flattopped 2 to 37mm papules along hairline, torso, back leg.  Dermoscopy confirmatory.  Objective  Left Upper Back: 8 mm deep dermal solitary papule compatible with epidermoid cyst.   A full examination was performed including scalp, head, eyes, ears, nose, lips, neck, chest, axillae, abdomen, back, buttocks, bilateral upper extremities, bilateral lower extremities, hands, feet, fingers, toes, fingernails, and toenails. All findings within normal limits unless otherwise noted below.   Assessment & Plan    Screening exam for skin cancer Chest - Medial [facility])  Seborrheic keratosis Right Upper Back  May leave if stable.  Cyst of skin Left Upper Back  Patient may leave if stable will or may choose to schedule 30 minutes for surgical removal.     I, [name], MD, have reviewed all documentation for this visit.  The documentation on 03/20/20 for the exam, diagnosis, procedures, and orders are all accurate and complete.

## 2020-04-03 ENCOUNTER — Encounter: Payer: Self-pay | Admitting: Dermatology

## 2020-04-04 ENCOUNTER — Other Ambulatory Visit: Payer: Self-pay | Admitting: *Deleted

## 2020-04-04 MED ORDER — BETAMETHASONE DIPROPIONATE 0.05 % EX CREA: TOPICAL_CREAM | Freq: Two times a day (BID) | CUTANEOUS | 3 refills | Status: DC | PRN

## 2020-07-23 ENCOUNTER — Encounter: Payer: Self-pay | Admitting: Internal Medicine

## 2020-07-23 ENCOUNTER — Ambulatory Visit: Payer: BC Managed Care – PPO | Admitting: Internal Medicine

## 2020-07-23 ENCOUNTER — Other Ambulatory Visit (INDEPENDENT_AMBULATORY_CARE_PROVIDER_SITE_OTHER): Payer: BC Managed Care – PPO

## 2020-07-23 VITALS — BP 120/80 | HR 76 | Ht 64.75 in | Wt 228.5 lb

## 2020-07-23 DIAGNOSIS — K580 Irritable bowel syndrome with diarrhea: Secondary | ICD-10-CM

## 2020-07-23 DIAGNOSIS — R10816 Epigastric abdominal tenderness: Secondary | ICD-10-CM

## 2020-07-23 DIAGNOSIS — K529 Noninfective gastroenteritis and colitis, unspecified: Secondary | ICD-10-CM | POA: Diagnosis not present

## 2020-07-23 DIAGNOSIS — F439 Reaction to severe stress, unspecified: Secondary | ICD-10-CM

## 2020-07-23 DIAGNOSIS — Z6838 Body mass index (BMI) 38.0-38.9, adult: Secondary | ICD-10-CM

## 2020-07-23 DIAGNOSIS — R159 Full incontinence of feces: Secondary | ICD-10-CM

## 2020-07-23 DIAGNOSIS — R32 Unspecified urinary incontinence: Secondary | ICD-10-CM

## 2020-07-23 DIAGNOSIS — E8881 Metabolic syndrome: Secondary | ICD-10-CM | POA: Diagnosis not present

## 2020-07-23 DIAGNOSIS — E669 Obesity, unspecified: Secondary | ICD-10-CM

## 2020-07-23 LAB — COMPREHENSIVE METABOLIC PANEL
ALT: 61 U/L — ABNORMAL HIGH (ref 0–35)
AST: 52 U/L — ABNORMAL HIGH (ref 0–37)
Albumin: 4.1 g/dL (ref 3.5–5.2)
Alkaline Phosphatase: 72 U/L (ref 39–117)
BUN: 8 mg/dL (ref 6–23)
CO2: 26 mEq/L (ref 19–32)
Calcium: 9.3 mg/dL (ref 8.4–10.5)
Chloride: 103 mEq/L (ref 96–112)
Creatinine, Ser: 0.72 mg/dL (ref 0.40–1.20)
GFR: 94.28 mL/min (ref >60.00)
Glucose, Bld: 192 mg/dL — ABNORMAL HIGH (ref 70–99)
Potassium: 4.1 mEq/L (ref 3.5–5.1)
Sodium: 138 mEq/L (ref 135–145)
Total Bilirubin: 0.5 mg/dL (ref 0.2–1.2)
Total Protein: 7.7 g/dL (ref 6.0–8.3)

## 2020-07-23 LAB — CBC WITH DIFFERENTIAL/PLATELET
Basophils Absolute: 0.1 10*3/uL (ref 0.0–0.1)
Basophils Relative: 0.9 % (ref 0.0–3.0)
Eosinophils Absolute: 0.2 10*3/uL (ref 0.0–0.7)
Eosinophils Relative: 2.5 % (ref 0.0–5.0)
HCT: 36.9 % (ref 36.0–46.0)
Hemoglobin: 11.9 g/dL — ABNORMAL LOW (ref 12.0–15.0)
Lymphocytes Relative: 24.6 % (ref 12.0–46.0)
Lymphs Abs: 2.2 10*3/uL (ref 0.7–4.0)
MCHC: 32.3 g/dL (ref 30.0–36.0)
MCV: 73.9 fl — ABNORMAL LOW (ref 78.0–100.0)
Monocytes Absolute: 0.5 10*3/uL (ref 0.1–1.0)
Monocytes Relative: 5.5 % (ref 3.0–12.0)
Neutro Abs: 6 10*3/uL (ref 1.4–7.7)
Neutrophils Relative %: 66.5 % (ref 43.0–77.0)
Platelets: 254 10*3/uL (ref 150.0–400.0)
RBC: 5 Mil/uL (ref 3.87–5.11)
RDW: 16.8 % — ABNORMAL HIGH (ref 11.5–15.5)
WBC: 9.1 10*3/uL (ref 4.0–10.5)

## 2020-07-23 LAB — AMYLASE: Amylase: 15 U/L — ABNORMAL LOW (ref 27–131)

## 2020-07-23 LAB — LIPASE: Lipase: 15 U/L (ref 11.0–59.0)

## 2020-07-23 LAB — TSH: TSH: 1.67 u[IU]/mL (ref 0.35–4.50)

## 2020-07-23 MED ORDER — DICYCLOMINE HCL 20 MG PO TABS: 20.0000 mg | ORAL_TABLET | Freq: Three times a day (TID) | ORAL | 2 refills | Status: DC

## 2020-07-23 NOTE — Progress Notes (Signed)
Paige Johns 55 y.o. 1965/12/20 [id number]  Assessment & Plan:   Encounter Diagnoses  Name Primary?  . Irritable bowel syndrome with diarrhea Yes  . Chronic diarrhea   . Abdominal obesity and metabolic syndrome   . Class 2 severe obesity due to excess calories with serious comorbidity and body mass index (BMI) of 38.0 to 38.9 in adult [facility])   . Urinary and fecal incontinence   . Situational stress   . Epigastric abdominal tenderness without rebound tenderness     This is quite complicated overall though suggestive of irritable bowel syndrome but she also has signs of pelvic floor issues.  She lives in [place] I am not sure if there is pelvic floor physical therapy in that area and will check.  Lab work-up and medications as below with follow-up in June.  I have also discussed with her how changing diet moving to a lower carbohydrate diet may help her feel better in more ways than 1.  This was introduced today for her to study.  Reducing stress will be important to help her lose weight as well.  Should fecal calprotectin be positive need to consider repeat colonoscopy.   Orders Placed This Encounter  Procedures  . Calprotectin, Fecal  . CBC w/Diff  . Comp Met (CMET)  . TSH  . Amylase  . Lipase     Meds ordered this encounter  Medications  . dicyclomine (BENTYL) 20 MG tablet    Sig: Take 1 tablet (20 mg total) by mouth 3 (three) times daily before meals.    Dispense:  90 tablet    Refill:  2   Read The Obesity Code by Dr. Sharman Johns or Fast, Feat, Repeat by Paige Johns and implement  suggestions. Investigate and sign up for the [email or web address].com website if desired and utilize those resources. Checkout Dr. Luis Johns on YouTube You can also look up Dr. Enrigue Johns on the Internet and YouTube I have provided handouts on insulin resistance, restricted feeding/intermittent fasting, and proper food choices to lower and eliminate insulin resistance and lose weight. Have  a long-term approach to this and do not expect rapid results but have a 1 to 2-year timeframe to change her eating and to become fat adapted.   She will return for follow-up on September 17, 2020  I appreciate the opportunity to care for this patient. CC: Paige Fear, DO  Subjective:   Chief Complaint: Diarrhea and fecal urgency with some leakage of stool  HPI Paige Johns is a 55 year old woman with a history of IBS GERD and hemorrhoids status post banding last seen by Paige Johns in 2019, here complaining of episodic diarrhea for several months.  This is occurred over the past several months, in the setting of her husband being diagnosed with dysautonomia and quite a bit of stress.  She sorted has a mixed pattern of stools in the past she had loose stools with metformin but that is not so much of an issue anymore.  She will get an urgent defecation and need to make it to the bathroom quickly while walking or out and about.  Stools are Bristol stool scale 6 in general.  If she takes Imodium she will get constipated and go several days without and then her hemorrhoids will bleed some.  She says she is sleeping okay.  No medication changes no dietary triggers noted for sure.  She is under stress and has gained a bit of weight.  She has not had antibiotics or an  episode of gastroenteritis in the recent months that preceded this.  She does report that she is a stress eater she is a "which alcoholic".  She would like to change eating and lose weight but has been a struggle with everything going on.  She also has stress urinary and urge urinary incontinence mostly urge.  She has had an abdominal hysterectomy years ago.  She is also had a history of a groin surgery for dermatofibrosarcoma protuberans.  Normal colonoscopy 2017 and normal EGD for dysphagia at that time as well Allergies  Allergen Reactions  . Oxycodone-Acetaminophen Rash    PERCOCET   Current Meds  Medication Sig  . ALPRAZolam (XANAX)  0.25 MG tablet Take 1 tablet by mouth as needed.  . betamethasone dipropionate 0.05 % cream Apply topically 2 (two) times daily as needed (Rash). Not for face or folds.  .    . loratadine (CLARITIN) 10 MG tablet Take 10 mg by mouth daily.  . meloxicam (MOBIC) 15 MG tablet Take 1 tablet by mouth daily.  Marland Kitchen MINIVELLE 0.1 MG/24HR patch Place 1 patch onto the skin 2 (two) times a week.  . pantoprazole (PROTONIX) 40 MG tablet Take 40 mg by mouth daily.  . sitaGLIPtin-metformin (JANUMET) 50-1000 MG tablet Take 1 tablet by mouth 2 (two) times daily with a meal.    Past Medical History:  Diagnosis Date  . Arthritis   . Dermatofibrosarcoma protuberans   . Diabetes mellitus   . Esophageal spasm   . Fibromyalgia   . GERD (gastroesophageal reflux disease)   . Hyperlipidemia   . IBS (irritable bowel syndrome)   . Insomnia   . Iron deficiency anemia   . Mouth sores    ulcers  . Obesity    Past Surgical History:  Procedure Laterality Date  . ABDOMINAL HYSTERECTOMY    . CHOLECYSTECTOMY  2001  . COLONOSCOPY  06/08/2003   normal  . groin surgery Right    dermato fibrosarcoma protuberans  . TONSILLECTOMY    . 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   Review of Systems As per HPI  Objective:   Physical Exam BP 120/80 (BP Location: Left Arm, Patient Position: Sitting, Cuff Size: Normal)   Pulse 76   Ht 5' 4.75" (1.645 m)  Comment: height measured without shoes  Wt 228 lb 8 oz (103.6 kg)   BMI 38.32 kg/m  Well-developed well-nourished obese white woman in no acute distress Abdominal exam notable for mild to moderate epigastric tenderness.  No rebound.  Bowel sounds present otherwise negative.    Female staff present for exam.  Anoderm looks normal, resting and voluntary tone of the anal sphincter is decreased.  Simulated defecation reveals some increased descent and appropriate abdominal contraction.  There is no mass or rectocele and the rectal exam is nontender without sign of fissure or other abnormality. Rectal decreased anal tone inc descent

## 2020-07-23 NOTE — Patient Instructions (Signed)
Your provider has requested that you go to the basement level for lab work before leaving today. Press "B" on the elevator. The lab is located at the first door on the left as you exit the elevator.  We have sent Dicyclomine to your pharmacy   Due to recent changes in healthcare laws, you may see the results of your imaging and laboratory studies on MyChart before your provider has had a chance to review them.  We understand that in some cases there may be results that are confusing or concerning to you. Not all laboratory results come back in the same time frame and the provider may be waiting for multiple results in order to interpret others.  Please give [place] 48 hours in order for your provider to thoroughly review all the results before contacting the office for clarification of your results.   I appreciate the  opportunity to care for you  Thank You   Ronney Lion

## 2020-07-25 DIAGNOSIS — E669 Obesity, unspecified: Secondary | ICD-10-CM | POA: Insufficient documentation

## 2020-07-25 DIAGNOSIS — R159 Full incontinence of feces: Secondary | ICD-10-CM | POA: Insufficient documentation

## 2020-07-25 DIAGNOSIS — K529 Noninfective gastroenteritis and colitis, unspecified: Secondary | ICD-10-CM | POA: Insufficient documentation

## 2020-07-25 DIAGNOSIS — E8881 Metabolic syndrome: Secondary | ICD-10-CM | POA: Insufficient documentation

## 2020-07-25 DIAGNOSIS — R32 Unspecified urinary incontinence: Secondary | ICD-10-CM | POA: Insufficient documentation

## 2020-07-26 ENCOUNTER — Other Ambulatory Visit: Payer: Self-pay | Admitting: *Deleted

## 2020-07-26 DIAGNOSIS — R159 Full incontinence of feces: Secondary | ICD-10-CM

## 2020-07-26 DIAGNOSIS — R32 Unspecified urinary incontinence: Secondary | ICD-10-CM

## 2020-07-26 LAB — CALPROTECTIN, FECAL: Calprotectin, Fecal: 16 ug/g (ref 0–120)

## 2020-07-27 ENCOUNTER — Other Ambulatory Visit: Payer: Self-pay

## 2020-07-27 DIAGNOSIS — R7989 Other specified abnormal findings of blood chemistry: Secondary | ICD-10-CM

## 2020-07-27 DIAGNOSIS — R1013 Epigastric pain: Secondary | ICD-10-CM

## 2020-08-06 ENCOUNTER — Other Ambulatory Visit: Payer: Self-pay

## 2020-08-06 ENCOUNTER — Ambulatory Visit (HOSPITAL_COMMUNITY)
Admission: RE | Admit: 2020-08-06 | Discharge: 2020-08-06 | Disposition: A | Discharge status: Home / Self Care | Payer: BC Managed Care – PPO | Source: Ambulatory Visit | Attending: Internal Medicine | Admitting: Internal Medicine

## 2020-08-06 DIAGNOSIS — R1013 Epigastric pain: Secondary | ICD-10-CM | POA: Insufficient documentation

## 2020-08-06 DIAGNOSIS — R7989 Other specified abnormal findings of blood chemistry: Secondary | ICD-10-CM | POA: Insufficient documentation

## 2020-09-17 ENCOUNTER — Ambulatory Visit: Payer: BC Managed Care – PPO | Admitting: Internal Medicine

## 2020-10-02 ENCOUNTER — Ambulatory Visit (INDEPENDENT_AMBULATORY_CARE_PROVIDER_SITE_OTHER): Payer: BC Managed Care – PPO | Admitting: Internal Medicine

## 2020-10-02 ENCOUNTER — Encounter: Payer: Self-pay | Admitting: Internal Medicine

## 2020-10-02 VITALS — BP 128/70 | HR 80 | Ht 64.0 in | Wt 221.6 lb

## 2020-10-02 DIAGNOSIS — E8881 Metabolic syndrome: Secondary | ICD-10-CM

## 2020-10-02 DIAGNOSIS — K580 Irritable bowel syndrome with diarrhea: Secondary | ICD-10-CM | POA: Diagnosis not present

## 2020-10-02 DIAGNOSIS — K760 Fatty (change of) liver, not elsewhere classified: Secondary | ICD-10-CM

## 2020-10-02 NOTE — Patient Instructions (Addendum)
Congratulations on your journey to low carb eating and early weight loss.  Due to recent changes in healthcare laws, you may see the results of your imaging and laboratory studies on MyChart before your provider has had a chance to review them.  We understand that in some cases there may be results that are confusing or concerning to you. Not all laboratory results come back in the same time frame and the provider may be waiting for multiple results in order to interpret others.  Please give [place] 48 hours in order for your provider to thoroughly review all the results before contacting the office for clarification of your results.   Read the Metabolical book by Dr Hali Marry.  I appreciate the opportunity to care for you. Silvano Rusk, MD, [facility]

## 2020-10-02 NOTE — Progress Notes (Signed)
Paige Johns 55 y.o. 1965/09/12 [id number]  Assessment & Plan:   Encounter Diagnoses  Name Primary?   Irritable bowel syndrome with diarrhea Yes   NAFLD (nonalcoholic fatty liver disease)    Abdominal obesity and metabolic syndrome     1/0/6269 is her next visit she will continue dietary changes.  She is congratulated on her efforts so far she will continue to follow through with the obesity code book and recommendations and we have discussed the book by Dr. Avon Gully called Metabolicaland she may read that as well.  I appreciate the opportunity to care for this patient. CC: Emelda Fear, DO   Subjective:   Chief Complaint: Follow-up of IBS and fatty liver disease  HPI Since last here [name] has started reading the obesity code and working on changing her eating habits as we discussed that and she has lost about 7 pounds.  Her IBS is improved as well.  Most of the IBS changes attributable to using dicyclomine 1 or 2 times a day.  She is pleased with her quality of life. Wt Readings from Last 3 Encounters:  10/02/20 221 lb 9.6 oz (100.5 kg)  07/23/20 228 lb 8 oz (103.6 kg)  01/13/18 220 lb 9.6 oz (100.1 kg)     Allergies  Allergen Reactions   Oxycodone-Acetaminophen Rash    PERCOCET   Current Meds  Medication Sig   ALPRAZolam (XANAX) 0.25 MG tablet Take 1 tablet by mouth as needed.   betamethasone dipropionate 0.05 % cream Apply topically 2 (two) times daily as needed (Rash). Not for face or folds.   dicyclomine (BENTYL) 20 MG tablet Take 1 tablet (20 mg total) by mouth 3 (three) times daily before meals.   loratadine (CLARITIN) 10 MG tablet Take 10 mg by mouth daily.   meloxicam (MOBIC) 15 MG tablet Take 1 tablet by mouth daily.   MINIVELLE 0.1 MG/24HR patch Place 1 patch onto the skin 2 (two) times a week.   pantoprazole (PROTONIX) 40 MG tablet Take 40 mg by mouth daily.   sitaGLIPtin-metformin (JANUMET) 50-1000 MG tablet Take 1 tablet by mouth 2 (two) times  daily with a meal.   tiZANidine (ZANAFLEX) 4 MG tablet Take 4 mg by mouth as needed for muscle spasms.   Past Medical History:  Diagnosis Date   Arthritis    Dermatofibrosarcoma protuberans    Diabetes mellitus    Esophageal spasm    Fibromyalgia    GERD (gastroesophageal reflux disease)    Hyperlipidemia    IBS (irritable bowel syndrome)    Insomnia    Iron deficiency anemia    Mouth sores    ulcers   Obesity    Past Surgical History:  Procedure Laterality Date   ABDOMINAL HYSTERECTOMY     CHOLECYSTECTOMY  2001   COLONOSCOPY  06/08/2003   normal   groin surgery Right    dermato fibrosarcoma protuberans   TONSILLECTOMY     UPPER GASTROINTESTINAL ENDOSCOPY  06/08/2003, 02/25/2011   2005 - normal 2012 - erosive esophagitis, 76 Fr dilation for dysphagia   Social History   Social History Narrative   Married 2 daughters + 3 granddaughters   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   Review of Systems  As above Objective:   Physical Exam BP 128/70   Pulse 80   Ht 5\' 4"  (1.626 m)   Wt 221 lb 9.6 oz (100.5 kg)   SpO2 97%   BMI 38.04 kg/m    24 minutes total time in the visit before during and after

## 2020-10-03 ENCOUNTER — Ambulatory Visit: Payer: BC Managed Care – PPO | Admitting: Physical Therapy

## 2020-10-04 ENCOUNTER — Ambulatory Visit: Payer: BC Managed Care – PPO | Admitting: Physical Therapy

## 2020-10-27 ENCOUNTER — Other Ambulatory Visit: Payer: Self-pay | Admitting: Internal Medicine

## 2020-11-01 ENCOUNTER — Other Ambulatory Visit: Payer: Self-pay

## 2020-11-01 ENCOUNTER — Ambulatory Visit: Payer: BC Managed Care – PPO | Attending: Internal Medicine | Admitting: Physical Therapy

## 2020-11-01 ENCOUNTER — Encounter: Payer: Self-pay | Admitting: Physical Therapy

## 2020-11-01 DIAGNOSIS — R252 Cramp and spasm: Secondary | ICD-10-CM | POA: Insufficient documentation

## 2020-11-01 DIAGNOSIS — M6281 Muscle weakness (generalized): Secondary | ICD-10-CM | POA: Diagnosis present

## 2020-11-01 NOTE — Patient Instructions (Signed)
Access Code: [email or web address] URL: [email or web address] Date: 11/01/2020 Prepared by: Jari Favre  Exercises Hooklying Rib Cage Breathing - 1 x daily - 7 x weekly - 3 sets - 10 reps Supine Diaphragmatic Breathing - 3 x daily - 7 x weekly - 1 sets - 10 reps Supine Pelvic Floor Contraction - 3 x daily - 7 x weekly - 1 sets - 10 reps - 1 sec hold  STRETCHING THE PELVIC FLOOR MUSCLES NO DILATOR  Supplies Vaginal lubricant Mirror (optional) Gloves (optional) or clean hands Positioning Start in a semi-reclined position with your head propped up. Bend your knees and place your thumb or finger at the vaginal opening. Procedure Apply a moderate amount of lubricant on the outer skin of your vagina, the labia minora.  Apply additional lubricant to your finger. Spread the skin away from the vaginal opening. Place the end of your finger at the opening. Do a maximum contraction of the pelvic floor muscles. Tighten the vagina and the anus maximally and relax. When you know they are relaxed, gently and slowly insert your finger into your vagina, directing your finger slightly downward, for 2-3 inches of insertion. Relax and stretch the 6 o'clock position Hold each stretch for _30-60 seconds, no pain more than 3/10 Repeat the stretching in the 4 o'clock and 8 o'clock positions. Next gently move your finger in a "U" shape  several times.  You can also enter a second finger to work to spread the vaginal opening wider from 3:00-6:00 and 6:00-9:00 or 3:00-9:00 Perform daily or every other day Once you have accomplished the techniques you may try them in standing with one foot resting on the tub, or in other positions.  This is a good stretch to do in the shower if you don't need to use lubricant.  This can be done at 35 weeks or later in your pregnancy.   [place] [address], [place], [place] [address] Phone # [phone] Fax [phone]

## 2020-11-02 NOTE — Therapy (Signed)
[facility] [address] [place], [place], [address] Phone: [phone]   Fax:  [phone]  Physical Therapy Treatment  Patient Details  Name: Paige Johns MRN: [id number] Date of Birth: Jul 01, 1965 Referring Provider (PT): Gatha Mayer, MD   Encounter Date: 11/01/2020   PT End of Session - 11/01/20 0753     Visit Number 1    Date for PT Re-Evaluation 01/24/21    Authorization Type BCBS    PT Start Time 0753    PT Stop Time 0835    PT Time Calculation (min) 42 min    Activity Tolerance Patient tolerated treatment well    Behavior During Therapy [facility] for tasks assessed/performed             Past Medical History:  Diagnosis Date   Arthritis    Dermatofibrosarcoma protuberans    Diabetes mellitus    Esophageal spasm    Fibromyalgia    GERD (gastroesophageal reflux disease)    Hyperlipidemia    IBS (irritable bowel syndrome)    Insomnia    Iron deficiency anemia    Mouth sores    ulcers   Obesity     Past Surgical History:  Procedure Laterality Date   ABDOMINAL HYSTERECTOMY     CHOLECYSTECTOMY  2001   COLONOSCOPY  06/08/2003   normal   groin surgery Right    dermato fibrosarcoma protuberans   TONSILLECTOMY     UPPER GASTROINTESTINAL ENDOSCOPY  06/08/2003, 02/25/2011   2005 - normal 2012 - erosive esophagitis, 77 Fr dilation for dysphagia    There were no vitals filed for this visit.   Subjective Assessment - 11/01/20 0803     Subjective Every so often I have the fecal leakage.  The baldder I use a pad every day.  I drink a lot of water and go to the bathroom every 1-2 hours to pee.  Pt reports nocturia1-2x. Pt states has a strong urge when getting to the bathroom    Pertinent History IBS    Patient Stated Goals Decrease leakage    Currently in Pain? No/denies                [facility] PT Assessment - 11/03/20 0001       Assessment   Medical Diagnosis R32,R15.9 (ICD-10-CM) -  Urinary and fecal incontinence    Referring Provider (PT) Gatha Mayer, MD    Onset Date/Surgical Date --   hysterectomy 2003 and bladder sling   Prior Therapy No      Precautions   Precautions None      Balance Screen   Has the patient fallen in the past 6 months No      [place] residence    Living Arrangements Spouse/significant other      Prior Function   Level of Independence Independent    Vocation Full time employment    Vocation Requirements sitting    Leisure water aerobics, walking      Cognition   Overall Cognitive Status Within Functional Limits for tasks assessed      Functional Tests   Functional tests Single leg stance;Squat      Squat   Comments trunk flexion increased      Single Leg Stance   Comments trendelenburg mild      Posture/Postural Control   Posture/Postural Control Postural limitations    Postural Limitations Forward head;Increased thoracic kyphosis;Anterior pelvic tilt;Right pelvic obliquity;Increased lumbar lordosis  AROM   Overall AROM Comments lumbar      Strength   Overall Strength Comments abd/add 4-/5      Ambulation/Gait   Gait Pattern Within Functional Limits                        Pelvic Floor Special Questions - 11/03/20 0001     Prior Pregnancies Yes    Number of Vaginal Deliveries 2    Any difficulty with labor and deliveries Yes   tearing   Currently Sexually Active Yes    Is this Painful Yes    Urinary Leakage Yes    Pad use yes, 1/day    Urinary urgency Yes    Fluid intake 80+ oz    Caffeine beverages usually not    Pelvic Floor Internal Exam pt identity confirmed and consent to do internal assessment was given    Exam Type Vaginal    Palpation TTP Lt levators, Rt side tight but less tender    Strength weak squeeze, no lift    Strength # of reps --   3/10sec quick   Strength # of seconds 2    Tone tight levators                          PT Short Term Goals - 11/03/20 1645       PT SHORT TERM GOAL #1   Title ind with initial HEP    Time 4    Period Weeks    Status New    Target Date 11/29/20               PT Long Term Goals - 11/02/20 1212       PT LONG TERM GOAL #1   Title Pt will be able to hold bladder for at least 2 hours to reduce frequency of voiding    Time 12    Period Weeks    Status New    Target Date 01/24/21      PT LONG TERM GOAL #2   Title Pt will report improved reduction of urge to void by 50% decrease    Time 12    Period Weeks    Status New    Target Date 01/24/21      PT LONG TERM GOAL #3   Title Pt will demonstrate kegel for at least 10 sec hold for improved functional activities without leakage    Time 12    Period Weeks    Status New    Target Date 01/24/21      PT LONG TERM GOAL #4   Title Pt will be ind with advanced HEP to maintain improved function    Time 12    Period Weeks    Status New    Target Date 01/24/21                   Plan - 11/03/20 1638     Clinical Impression Statement Pt presents to skilled PT due to both bladder and fecal incontinence. Pt is more disrupted by urinary incontinence and fecal is ever so often.  Pt tolerated internal assessment of the pelvic floor vaginally and was able to engage muscles to 2/5 MMTholding for 2 seconds at a time.  Pt could do 3 quick flickes before diminished strength. Overall, weak and lacking endurance of the pelvic floor. Pt is also TTP Lt levators and Rt side is  tight with mild tenderness.  Pt has posture as mentioned above and tension of ribcage and diaphragm.  She will benefit from skilled PT to address impairments and resotre full function.    Personal Factors and Comorbidities Comorbidity 1;Time since onset of injury/illness/exacerbation    Comorbidities IBS    Examination-Activity Limitations Continence;Toileting    Examination-Participation Restrictions Community Activity     Stability/Clinical Decision Making Evolving/Moderate complexity    Clinical Decision Making Moderate    Rehab Potential Excellent    PT Frequency 1x / week    PT Duration 12 weeks    PT Treatment/Interventions ADLs/Self Care Home Management;Biofeedback;Cryotherapy;Electrical Stimulation;Moist Heat;Therapeutic activities;Therapeutic exercise;Neuromuscular re-education;Patient/family education;Manual techniques;Dry needling;Passive range of motion;Taping    PT Next Visit Plan biofeedback if okay to do and work on kegel isolating and basic core - breathing and f/u on self massage    PT Home Exercise Plan see above    Consulted and Agree with Plan of Care Patient             Patient will benefit from skilled therapeutic intervention in order to improve the following deficits and impairments:  Decreased coordination, Impaired tone, Increased muscle spasms, Decreased endurance, Decreased strength  Visit Diagnosis: Muscle weakness (generalized)  Cramp and spasm     Problem List Patient Active Problem List   Diagnosis Date Noted   Abdominal obesity and metabolic syndrome 99991111   Chronic diarrhea 07/25/2020   Urinary and fecal incontinence 07/25/2020   Prolapsed internal hemorrhoids, grade 2 w/ fecal soiling 06/21/2015   Loose stools - likely from metformin 06/21/2015   OBESITY 10/24/2009   GERD 10/24/2009   IRRITABLE BOWEL SYNDROME DIARRHEA PREDOMINANT 05/12/2007   Dysphagia 05/12/2007    Camillo Flaming Danelia Snodgrass, PT 11/03/2020, 5:01 PM  [facility] [address], [place], [place], [address] Phone: [phone]   Fax:  [phone]  Name: KIAIRRA AQUILINA MRN: [id number] Date of Birth: [birth year]

## 2020-11-19 ENCOUNTER — Encounter: Payer: BC Managed Care – PPO | Admitting: Physical Therapy

## 2020-11-30 ENCOUNTER — Encounter: Payer: BC Managed Care – PPO | Admitting: Physical Therapy

## 2020-12-06 ENCOUNTER — Encounter: Payer: Self-pay | Admitting: Physical Therapy

## 2020-12-06 ENCOUNTER — Ambulatory Visit: Payer: BC Managed Care – PPO | Attending: Internal Medicine | Admitting: Physical Therapy

## 2020-12-06 ENCOUNTER — Other Ambulatory Visit: Payer: Self-pay

## 2020-12-06 DIAGNOSIS — M6281 Muscle weakness (generalized): Secondary | ICD-10-CM

## 2020-12-06 DIAGNOSIS — R252 Cramp and spasm: Secondary | ICD-10-CM | POA: Diagnosis present

## 2020-12-06 NOTE — Therapy (Signed)
[facility] [address], [place], [place], [address] Phone: [phone]   Fax:  [phone]  Physical Therapy Treatment  Patient Details  Name: Paige Johns MRN: [id number] Date of Birth: [birth year] Referring Provider (PT): Gatha Mayer, MD   Encounter Date: 12/06/2020   PT End of Session - 12/06/20 0758     Visit Number 2    Date for PT Re-Evaluation 01/24/21    Authorization Type BCBS    PT Start Time 0758    PT Stop Time 0840    PT Time Calculation (min) 42 min    Activity Tolerance Patient tolerated treatment well    Behavior During Therapy [facility] for tasks assessed/performed             Past Medical History:  Diagnosis Date   Arthritis    Dermatofibrosarcoma protuberans    Diabetes mellitus    Esophageal spasm    Fibromyalgia    GERD (gastroesophageal reflux disease)    Hyperlipidemia    IBS (irritable bowel syndrome)    Insomnia    Iron deficiency anemia    Mouth sores    ulcers   Obesity     Past Surgical History:  Procedure Laterality Date   ABDOMINAL HYSTERECTOMY     CHOLECYSTECTOMY  2001   COLONOSCOPY  06/08/2003   normal   groin surgery Right    dermato fibrosarcoma protuberans   TONSILLECTOMY     UPPER GASTROINTESTINAL ENDOSCOPY  06/08/2003, 02/25/2011   2005 - normal 2012 - erosive esophagitis, 64 Fr dilation for dysphagia    There were no vitals filed for this visit.   Subjective Assessment - 12/06/20 0802     Subjective I have been sick for a few weeks with bad sinus infection.  I had two episodes where I just peed all over myself, I couldn't stop it    Pertinent History IBS    Patient Stated Goals Decrease leakage    Currently in Pain? No/denies                               [facility] Adult PT Treatment/Exercise - 12/06/20 0001       Neuro Re-ed    Neuro Re-ed Details  biofeedback      Exercises   Exercises Lumbar      Lumbar Exercises:  Stretches   Figure 4 Stretch 1 rep;60 seconds;Supine    Other Lumbar Stretch Exercise butterfly      Lumbar Exercises: Standing   Other Standing Lumbar Exercises weight shift and march with kegel    Other Standing Lumbar Exercises lean on table      Lumbar Exercises: Supine   AB Set Limitations kegel with and without ball squeeze    Bridge with Ball Squeeze 20 reps    Other Supine Lumbar Exercises biofeedback with 4 sec hold , 4 sec rest then 6 and 6                    PT Education - 12/06/20 0854     Education Details Access Code: [email or web address]    Person(s) Educated Patient    Methods Explanation;Demonstration;Tactile cues;Verbal cues;Handout    Comprehension Verbalized understanding;Returned demonstration              PT Short Term Goals - 11/03/20 1645       PT SHORT TERM GOAL #1   Title ind with initial  HEP    Time 4    Period Weeks    Status New    Target Date 11/29/20               PT Long Term Goals - 11/02/20 1212       PT LONG TERM GOAL #1   Title Pt will be able to hold bladder for at least 2 hours to reduce frequency of voiding    Time 12    Period Weeks    Status New    Target Date 01/24/21      PT LONG TERM GOAL #2   Title Pt will report improved reduction of urge to void by 50% decrease    Time 12    Period Weeks    Status New    Target Date 01/24/21      PT LONG TERM GOAL #3   Title Pt will demonstrate kegel for at least 10 sec hold for improved functional activities without leakage    Time 12    Period Weeks    Status New    Target Date 01/24/21      PT LONG TERM GOAL #4   Title Pt will be ind with advanced HEP to maintain improved function    Time 12    Period Weeks    Status New    Target Date 01/24/21                   Plan - 12/06/20 0851     Clinical Impression Statement Pt did well with biofeedback.  She still needed a lot of co-contracting to elicit a pelvic floor contraction.  Pt was given intiial  HEP today for strengthening.  Pt will benefit from skilled PT to continue to address strength and coordination.    PT Treatment/Interventions ADLs/Self Care Home Management;Biofeedback;Cryotherapy;Electrical Stimulation;Moist Heat;Therapeutic activities;Therapeutic exercise;Neuromuscular re-education;Patient/family education;Manual techniques;Dry needling;Passive range of motion;Taping    PT Next Visit Plan f/u on HEP and progress strength as needed; biofeedback #2 if she wants to use it again    PT Home Exercise Plan Access Code: [place]             Patient will benefit from skilled therapeutic intervention in order to improve the following deficits and impairments:  Decreased coordination, Impaired tone, Increased muscle spasms, Decreased endurance, Decreased strength  Visit Diagnosis: Muscle weakness (generalized)  Cramp and spasm     Problem List Patient Active Problem List   Diagnosis Date Noted   Abdominal obesity and metabolic syndrome 99991111   Chronic diarrhea 07/25/2020   Urinary and fecal incontinence 07/25/2020   Prolapsed internal hemorrhoids, grade 2 w/ fecal soiling 06/21/2015   Loose stools - likely from metformin 06/21/2015   OBESITY 10/24/2009   GERD 10/24/2009   IRRITABLE BOWEL SYNDROME DIARRHEA PREDOMINANT 05/12/2007   Dysphagia 05/12/2007    Camillo Flaming Kyran Connaughton, PT 12/06/2020, 8:57 AM  [facility] [address] [place], [place], [address] Phone: [phone]   Fax:  [phone]  Name: Paige Johns MRN: [id number] Date of Birth: [birth year]

## 2020-12-06 NOTE — Patient Instructions (Signed)
Access Code: [email or web address] URL: [email or web address] Date: 12/06/2020 Prepared by: Jari Favre  Exercises Supine Bridge with [employer] Between Knees - 2-3 x daily - 7 x weekly - 1 sets - 10 reps Table Lean - 2-3 x daily - 7 x weekly - 1 sets - 10 reps - 3 seonds hold Supine Figure 4 Piriformis Stretch - 1 x daily - 7 x weekly - 3 reps - 1 sets - 30 sec hold

## 2020-12-11 ENCOUNTER — Ambulatory Visit: Payer: BC Managed Care – PPO | Admitting: Internal Medicine

## 2020-12-13 ENCOUNTER — Encounter: Payer: Self-pay | Admitting: Physical Therapy

## 2020-12-13 ENCOUNTER — Other Ambulatory Visit: Payer: Self-pay

## 2020-12-13 ENCOUNTER — Ambulatory Visit: Payer: BC Managed Care – PPO | Admitting: Physical Therapy

## 2020-12-13 DIAGNOSIS — R252 Cramp and spasm: Secondary | ICD-10-CM

## 2020-12-13 DIAGNOSIS — M6281 Muscle weakness (generalized): Secondary | ICD-10-CM

## 2020-12-13 NOTE — Patient Instructions (Signed)
Access Code: [email or web address] URL: [email or web address] Date: 12/13/2020 Prepared by: Jari Favre  Exercises Supine Bridge with [employer] Between Knees - 2-3 x daily - 7 x weekly - 1 sets - 10 reps Table Lean - 2-3 x daily - 7 x weekly - 1 sets - 10 reps - 3 seonds hold Supine Figure 4 Piriformis Stretch - 1 x daily - 7 x weekly - 3 reps - 1 sets - 30 sec hold Seated Figure 4 Piriformis Stretch - 1 x daily - 7 x weekly - 3 reps - 1 sets - 30 hold Seated Pelvic Floor Contraction with Hip Abduction and Resistance Loop - 3 x daily - 7 x weekly - 1 sets - 10 reps - 3 sec hold

## 2020-12-13 NOTE — Therapy (Signed)
[facility] [address], [place], [place], [address] Phone: [phone]   Fax:  [phone]  Physical Therapy Treatment  Patient Details  Name: Paige Johns MRN: [id number] Date of Birth: 07-09-1965 Referring Provider (PT): Gatha Mayer, MD   Encounter Date: 12/13/2020   PT End of Session - 12/13/20 0757     Visit Number 3    Date for PT Re-Evaluation 01/24/21    Authorization Type BCBS    PT Start Time 0757    PT Stop Time 0838    PT Time Calculation (min) 41 min    Activity Tolerance Patient tolerated treatment well    Behavior During Therapy [facility] for tasks assessed/performed             Past Medical History:  Diagnosis Date   Arthritis    Dermatofibrosarcoma protuberans    Diabetes mellitus    Esophageal spasm    Fibromyalgia    GERD (gastroesophageal reflux disease)    Hyperlipidemia    IBS (irritable bowel syndrome)    Insomnia    Iron deficiency anemia    Mouth sores    ulcers   Obesity     Past Surgical History:  Procedure Laterality Date   ABDOMINAL HYSTERECTOMY     CHOLECYSTECTOMY  2001   COLONOSCOPY  06/08/2003   normal   groin surgery Right    dermato fibrosarcoma protuberans   TONSILLECTOMY     UPPER GASTROINTESTINAL ENDOSCOPY  06/08/2003, 02/25/2011   2005 - normal 2012 - erosive esophagitis, 1 Fr dilation for dysphagia    There were no vitals filed for this visit.   Subjective Assessment - 12/13/20 0931     Subjective Still about the same    Patient Stated Goals Decrease leakage    Currently in Pain? No/denies    Multiple Pain Sites No                               [facility] Adult PT Treatment/Exercise - 12/13/20 0001       Lumbar Exercises: Stretches   Figure 4 Stretch 1 rep;60 seconds;Seated      Lumbar Exercises: Seated   Other Seated Lumbar Exercises clam yellow band - 20x      Lumbar Exercises: Sidelying   Clam Limitations reverse clam  10x each      Manual Therapy   Manual Therapy Soft tissue mobilization    Soft tissue mobilization lumbar and gluteals bil              Trigger Point Dry Needling - 12/13/20 0001     Consent Given? Yes    Education Handout Provided Previously provided   reviewed and she has had before   Muscles Treated Back/Hip Gluteus minimus;Gluteus medius;Lumbar multifidi    Gluteus Minimus Response Twitch response elicited;Palpable increased muscle length    Gluteus Medius Response Twitch response elicited;Palpable increased muscle length    Lumbar multifidi Response Twitch response elicited;Palpable increased muscle length                   PT Education - 12/13/20 0841     Education Details Access Code: [email or web address]    Person(s) Educated Patient    Methods Explanation;Demonstration;Tactile cues;Verbal cues;Handout    Comprehension Verbalized understanding;Returned demonstration              PT Short Term Goals - 12/13/20 [id number]  PT SHORT TERM GOAL #1   Title ind with initial HEP    Status Achieved               PT Long Term Goals - 11/02/20 1212       PT LONG TERM GOAL #1   Title Pt will be able to hold bladder for at least 2 hours to reduce frequency of voiding    Time 12    Period Weeks    Status New    Target Date 01/24/21      PT LONG TERM GOAL #2   Title Pt will report improved reduction of urge to void by 50% decrease    Time 12    Period Weeks    Status New    Target Date 01/24/21      PT LONG TERM GOAL #3   Title Pt will demonstrate kegel for at least 10 sec hold for improved functional activities without leakage    Time 12    Period Weeks    Status New    Target Date 01/24/21      PT LONG TERM GOAL #4   Title Pt will be ind with advanced HEP to maintain improved function    Time 12    Period Weeks    Status New    Target Date 01/24/21                   Plan - 12/13/20 0841     Clinical Impression Statement Pt has been  doing exercises.  She feels mostly tight and that is causing difficulty with the kegels.  No changes with leakage and bladder control yet. Today's session focused on STM to get lumbar and ilium in better alignment.  Pt has very tender trigger points throughout glute med/min and dry needling release a little but large muscle knots remained.  Pt given stretches and exercises to work on imporved pelvic stabilty and flexibility.    PT Treatment/Interventions ADLs/Self Care Home Management;Biofeedback;Cryotherapy;Electrical Stimulation;Moist Heat;Therapeutic activities;Therapeutic exercise;Neuromuscular re-education;Patient/family education;Manual techniques;Dry needling;Passive range of motion;Taping    PT Next Visit Plan pelvic tilts, glute strength and STM, f/u on DN to lumbar and Lt glutes    PT Home Exercise Plan Access Code: [email or web address]    Consulted and Agree with Plan of Care Patient             Patient will benefit from skilled therapeutic intervention in order to improve the following deficits and impairments:  Decreased coordination, Impaired tone, Increased muscle spasms, Decreased endurance, Decreased strength  Visit Diagnosis: Muscle weakness (generalized)  Cramp and spasm     Problem List Patient Active Problem List   Diagnosis Date Noted   Abdominal obesity and metabolic syndrome 99991111   Chronic diarrhea 07/25/2020   Urinary and fecal incontinence 07/25/2020   Prolapsed internal hemorrhoids, grade 2 w/ fecal soiling 06/21/2015   Loose stools - likely from metformin 06/21/2015   OBESITY 10/24/2009   GERD 10/24/2009   IRRITABLE BOWEL SYNDROME DIARRHEA PREDOMINANT 05/12/2007   Dysphagia 05/12/2007    Camillo Flaming Addalee Kavanagh, PT 12/13/2020, 9:31 AM  [facility] [address] [place], [place], [address] Phone: [phone]   Fax:  [phone]  Name: Paige Johns MRN: [id number] Date of Birth:  [birth year]

## 2021-01-03 ENCOUNTER — Encounter: Payer: Self-pay | Admitting: Physical Therapy

## 2021-01-03 ENCOUNTER — Other Ambulatory Visit: Payer: Self-pay

## 2021-01-03 ENCOUNTER — Ambulatory Visit: Payer: BC Managed Care – PPO | Admitting: Physical Therapy

## 2021-01-03 DIAGNOSIS — M6281 Muscle weakness (generalized): Secondary | ICD-10-CM | POA: Diagnosis not present

## 2021-01-03 DIAGNOSIS — R252 Cramp and spasm: Secondary | ICD-10-CM

## 2021-01-03 NOTE — Therapy (Signed)
[facility] [address] [place], [place], [address] Phone: [phone]   Fax:  [phone]  Physical Therapy Treatment  Patient Details  Name: Paige Johns MRN: [id number] Date of Birth: [birth year] Referring Provider (PT): Gatha Mayer, MD   Encounter Date: 01/03/2021   PT End of Session - 01/03/21 0802     Visit Number 4    Date for PT Re-Evaluation 01/24/21    Authorization Type BCBS    PT Start Time 0802    PT Stop Time 0840    PT Time Calculation (min) 38 min    Activity Tolerance Patient tolerated treatment well    Behavior During Therapy [facility] for tasks assessed/performed             Past Medical History:  Diagnosis Date   Arthritis    Dermatofibrosarcoma protuberans    Diabetes mellitus    Esophageal spasm    Fibromyalgia    GERD (gastroesophageal reflux disease)    Hyperlipidemia    IBS (irritable bowel syndrome)    Insomnia    Iron deficiency anemia    Mouth sores    ulcers   Obesity     Past Surgical History:  Procedure Laterality Date   ABDOMINAL HYSTERECTOMY     CHOLECYSTECTOMY  2001   COLONOSCOPY  06/08/2003   normal   groin surgery Right    dermato fibrosarcoma protuberans   TONSILLECTOMY     UPPER GASTROINTESTINAL ENDOSCOPY  06/08/2003, 02/25/2011   2005 - normal 2012 - erosive esophagitis, 64 Fr dilation for dysphagia    There were no vitals filed for this visit.   Subjective Assessment - 01/03/21 0805     Subjective Pt states she went for a walk at work at couldn't make it back to the bathroom.  It hasn't happened for a few weeks but this was an explosion.  I go to the bathroom a lot and have some bladder leakage.    Pertinent History IBS    Patient Stated Goals Decrease leakage    Currently in Pain? No/denies                               [facility] Adult PT Treatment/Exercise - 01/03/21 0001       Lumbar Exercises: Stretches   Figure 4 Stretch  1 rep;60 seconds;Seated    Other Lumbar Stretch Exercise pelvic tilts - 15x      Lumbar Exercises: Seated   Sit to Stand 10 reps   kegel     Manual Therapy   Soft tissue mobilization lumbar and gluteals bil              Trigger Point Dry Needling - 01/03/21 0001     Consent Given? Yes    Education Handout Provided Previously provided    Gluteus Minimus Response Twitch response elicited;Palpable increased muscle length    Gluteus Medius Response Twitch response elicited;Palpable increased muscle length    Lumbar multifidi Response Twitch response elicited;Palpable increased muscle length                     PT Short Term Goals - 12/13/20 0841       PT SHORT TERM GOAL #1   Title ind with initial HEP    Status Achieved               PT Long Term Goals - 11/02/20 1212  PT LONG TERM GOAL #1   Title Pt will be able to hold bladder for at least 2 hours to reduce frequency of voiding    Time 12    Period Weeks    Status New    Target Date 01/24/21      PT LONG TERM GOAL #2   Title Pt will report improved reduction of urge to void by 50% decrease    Time 12    Period Weeks    Status New    Target Date 01/24/21      PT LONG TERM GOAL #3   Title Pt will demonstrate kegel for at least 10 sec hold for improved functional activities without leakage    Time 12    Period Weeks    Status New    Target Date 01/24/21      PT LONG TERM GOAL #4   Title Pt will be ind with advanced HEP to maintain improved function    Time 12    Period Weeks    Status New    Target Date 01/24/21                   Plan - 01/03/21 0843     Clinical Impression Statement Pt reports she is 30% better but still having issues and had one episode of explosive diarrhea last week.  Pt was educated on urge techniques and new exercises given to progress her strength.  Pt still having a lot of tension in gluteal and lumbar paraspinals and she is responding well to dry  needling for improved pelvic alignment.    PT Treatment/Interventions ADLs/Self Care Home Management;Biofeedback;Cryotherapy;Electrical Stimulation;Moist Heat;Therapeutic activities;Therapeutic exercise;Neuromuscular re-education;Patient/family education;Manual techniques;Dry needling;Passive range of motion;Taping    PT Next Visit Plan DN #3 if needed; re-assess internally for strength and endurance; gluteal and core strength    PT Home Exercise Plan Access Code: [email or web address]    Consulted and Agree with Plan of Care Patient             Patient will benefit from skilled therapeutic intervention in order to improve the following deficits and impairments:  Decreased coordination, Impaired tone, Increased muscle spasms, Decreased endurance, Decreased strength  Visit Diagnosis: Muscle weakness (generalized)  Cramp and spasm     Problem List Patient Active Problem List   Diagnosis Date Noted   Abdominal obesity and metabolic syndrome 77/82/4235   Chronic diarrhea 07/25/2020   Urinary and fecal incontinence 07/25/2020   Prolapsed internal hemorrhoids, grade 2 w/ fecal soiling 06/21/2015   Loose stools - likely from metformin 06/21/2015   OBESITY 10/24/2009   GERD 10/24/2009   IRRITABLE BOWEL SYNDROME DIARRHEA PREDOMINANT 05/12/2007   Dysphagia 05/12/2007    Paige Johns, PT 01/03/2021, 8:48 AM  [facility] [address], [place], [place], [address] Phone: [phone]   Fax:  [phone]  Name: Paige Johns MRN: [id number] Date of Birth: April 04, 1966

## 2021-01-18 ENCOUNTER — Telehealth: Payer: Self-pay

## 2021-01-18 NOTE — Telephone Encounter (Signed)
NOTES SCANNED TO REFERRAL 

## 2021-01-23 ENCOUNTER — Encounter: Payer: Self-pay | Admitting: Internal Medicine

## 2021-01-23 ENCOUNTER — Ambulatory Visit (INDEPENDENT_AMBULATORY_CARE_PROVIDER_SITE_OTHER): Payer: BC Managed Care – PPO | Admitting: Internal Medicine

## 2021-01-23 VITALS — BP 104/72 | HR 64 | Ht 65.0 in | Wt 223.0 lb

## 2021-01-23 DIAGNOSIS — K580 Irritable bowel syndrome with diarrhea: Secondary | ICD-10-CM

## 2021-01-23 DIAGNOSIS — K760 Fatty (change of) liver, not elsewhere classified: Secondary | ICD-10-CM

## 2021-01-23 DIAGNOSIS — M6289 Other specified disorders of muscle: Secondary | ICD-10-CM

## 2021-01-23 DIAGNOSIS — K219 Gastro-esophageal reflux disease without esophagitis: Secondary | ICD-10-CM | POA: Diagnosis not present

## 2021-01-23 DIAGNOSIS — R0789 Other chest pain: Secondary | ICD-10-CM | POA: Diagnosis not present

## 2021-01-23 MED ORDER — ESOMEPRAZOLE MAGNESIUM 40 MG PO CPDR: 40.0000 mg | DELAYED_RELEASE_CAPSULE | Freq: Every day | ORAL | 3 refills | Status: DC

## 2021-01-23 NOTE — Progress Notes (Signed)
Paige Johns 55 y.o. 1965/07/31 [id number]  Assessment & Plan:   Encounter Diagnoses  Name Primary?   Irritable bowel syndrome with diarrhea Yes   Gastroesophageal reflux disease, unspecified whether esophagitis present    NAFLD (nonalcoholic fatty liver disease)    Chest wall tenderness    Pelvic floor dysfunction in female    Continue current therapy which includes as needed dicyclomine and the pelvic floor physical therapy.  Change pantoprazole to esomeprazole 40 mg daily.  Await cardiology evaluation.  Her symptoms may be from her fibromyalgia.  She asks if she needs an endoscopy and I do not think at this time though if she goes on to have persistent problems we could consider 1.  I am not sure that would change therapy but it might be necessary to look for esophagitis in the face of PPI and to consider alternative management.  Again I want to see what her cardiology evaluation tells [place] first.  Return as needed at this point.  CC: Tillman Abide, FNP     Subjective:   Chief Complaint:  HPI Paige Johns reports things are relatively stable.  She is continuing investigating metabolic health by reading books currently on metabolic all.  She is also working on trying to improve her sleep.  She is been having some substernal chest pain radiating to the left which is not associated with exertion or movement its intermittent and fleeting and without clear trigger.  This is not typical of her GERD type symptoms.  She wonders about changing PPI.  Previously on omeprazole now on pantoprazole for many years.  IBS is similar she has had 1 episode of incontinence but she is working with PT, it sounds like she may have high tone pelvic floor dysfunction.  She has been to a couple of sessions and will continue that.  Her husband's status is a little bit better he is on a new medication which has helped but his dysautonomia is still a major issue in their lives.  Wt Readings from Last 3  Encounters:  01/23/21 223 lb (101.2 kg)  10/02/20 221 lb 9.6 oz (100.5 kg)  07/23/20 228 lb 8 oz (103.6 kg)     Allergies  Allergen Reactions   Oxycodone-Acetaminophen Rash    PERCOCET   Current Meds  Medication Sig   ALPRAZolam (XANAX) 0.25 MG tablet Take 1 tablet by mouth as needed.   betamethasone dipropionate 0.05 % cream Apply topically 2 (two) times daily as needed (Rash). Not for face or folds.   dicyclomine (BENTYL) 20 MG tablet TAKE 1 TABLET (20 MG TOTAL) BY MOUTH 3 (THREE) TIMES DAILY BEFORE MEALS.   loratadine (CLARITIN) 10 MG tablet Take 10 mg by mouth daily.   meloxicam (MOBIC) 15 MG tablet Take 1 tablet by mouth daily.   MINIVELLE 0.1 MG/24HR patch Place 1 patch onto the skin 2 (two) times a week.   pantoprazole (PROTONIX) 40 MG tablet Take 40 mg by mouth daily.   sitaGLIPtin-metformin (JANUMET) 50-1000 MG tablet Take 1 tablet by mouth 2 (two) times daily with a meal.   tiZANidine (ZANAFLEX) 4 MG tablet Take 4 mg by mouth as needed for muscle spasms.   Past Medical History:  Diagnosis Date   Arthritis    Dermatofibrosarcoma protuberans    Diabetes mellitus    Esophageal spasm    Fibromyalgia    GERD (gastroesophageal reflux disease)    Hyperlipidemia    IBS (irritable bowel syndrome)    Insomnia  Iron deficiency anemia    Mouth sores    ulcers   Obesity    Past Surgical History:  Procedure Laterality Date   ABDOMINAL HYSTERECTOMY     CHOLECYSTECTOMY  2001   COLONOSCOPY  06/08/2003   normal   groin surgery Right    dermato fibrosarcoma protuberans   TONSILLECTOMY     UPPER GASTROINTESTINAL ENDOSCOPY  06/08/2003, 02/25/2011   2005 - normal 2012 - erosive esophagitis, 60 Fr dilation for dysphagia   Social History   Social History Narrative   Married 2 daughters + 3 granddaughters   Customer Service specialist [place]   Alcohol no   Tobacco no   Minimal caffeine   family history includes Ataxia in her mother; Atrial fibrillation in her mother;  Breast cancer in her maternal aunt; CAD in her maternal grandmother; Diabetes in her mother and paternal grandfather; Heart disease in her maternal grandmother; Hypertension in her mother; Lung cancer in her father and maternal uncle.   Review of Systems As above  Objective:   Physical Exam BP 104/72   Pulse 64   Ht 5\' 5"  (1.651 m)   Wt 223 lb (101.2 kg)   BMI 37.11 kg/m  Pleasant well-developed well-nourished no acute distress, obese white woman The lungs are clear I hear normal S1-S2 no rubs murmurs or gallops The chest wall is tender diffusely

## 2021-01-23 NOTE — Patient Instructions (Signed)
We have sent the following medications to your pharmacy for you to pick up at your convenience: Generic Nexium   Please have the cardiologist send [place] a copy of your view.   I appreciate the opportunity to care for you. Silvano Rusk, MD, [facility]

## 2021-01-29 ENCOUNTER — Encounter: Payer: BC Managed Care – PPO | Admitting: Physical Therapy

## 2021-02-12 ENCOUNTER — Other Ambulatory Visit: Payer: Self-pay

## 2021-02-12 ENCOUNTER — Ambulatory Visit: Payer: BC Managed Care – PPO | Attending: Internal Medicine | Admitting: Physical Therapy

## 2021-02-12 ENCOUNTER — Encounter: Payer: Self-pay | Admitting: Physical Therapy

## 2021-02-12 DIAGNOSIS — M6281 Muscle weakness (generalized): Secondary | ICD-10-CM | POA: Insufficient documentation

## 2021-02-12 DIAGNOSIS — R252 Cramp and spasm: Secondary | ICD-10-CM | POA: Insufficient documentation

## 2021-02-12 NOTE — Patient Instructions (Signed)
Access Code: [email or web address] URL: [email or web address] Date: 02/12/2021 Prepared by: Jari Favre  Exercises Supine Bridge with [employer] Between Knees - 2-3 x daily - 7 x weekly - 1 sets - 10 reps Table Lean - 2-3 x daily - 7 x weekly - 1 sets - 10 reps - 3 seonds hold Supine Figure 4 Piriformis Stretch - 1 x daily - 7 x weekly - 1 sets - 3 reps - 30 sec hold Seated Figure 4 Piriformis Stretch - 1 x daily - 7 x weekly - 1 sets - 3 reps - 30 hold Seated Pelvic Floor Contraction with Hip Abduction and Resistance Loop - 3 x daily - 7 x weekly - 1 sets - 10 reps - 3 sec hold Sit to Stand with Pelvic Floor Contraction - 1 x daily - 7 x weekly - 3 sets - 10 reps TL Sidebending Stretch - Single Arm Overhead - 1 x daily - 7 x weekly - 1 sets - 5 reps - 10 sec hold

## 2021-02-12 NOTE — Therapy (Signed)
[place] @ [place], [place], [address] Phone: [phone]   Fax:  [phone]  Physical Therapy Treatment  Patient Details  Name: Paige Johns MRN: [id number] Date of Birth: 04-Oct-1965 Referring Provider (PT): Gatha Mayer, MD   Encounter Date: 02/12/2021   PT End of Session - 02/12/21 0843     Visit Number 5    Date for PT Re-Evaluation 01/24/21    Authorization Type BCBS    PT Start Time 0800    PT Stop Time 0840    PT Time Calculation (min) 40 min    Activity Tolerance Patient tolerated treatment well    Behavior During Therapy [facility] for tasks assessed/performed             Past Medical History:  Diagnosis Date   Arthritis    Dermatofibrosarcoma protuberans    Diabetes mellitus    Esophageal spasm    Fibromyalgia    GERD (gastroesophageal reflux disease)    Hyperlipidemia    IBS (irritable bowel syndrome)    Insomnia    Iron deficiency anemia    Mouth sores    ulcers   Obesity     Past Surgical History:  Procedure Laterality Date   ABDOMINAL HYSTERECTOMY     CHOLECYSTECTOMY  2001   COLONOSCOPY  06/08/2003   normal   groin surgery Right    dermato fibrosarcoma protuberans   TONSILLECTOMY     UPPER GASTROINTESTINAL ENDOSCOPY  06/08/2003, 02/25/2011   2005 - normal 2012 - erosive esophagitis, 35 Fr dilation for dysphagia    There were no vitals filed for this visit.   Subjective Assessment - 02/12/21 0804     Subjective After being really sick I have not felt much better.  I bent over and peed the other day.  I have been sneezing and coughing a lot due to sinus infection.  Pt had stomach virus and was vomiting with diarrhea. Pt states back is the same    Pertinent History IBS    Patient Stated Goals Decrease leakage    Currently in Pain? No/denies                               [facility] Adult PT Treatment/Exercise - 02/12/21 0001       Lumbar Exercises:  Stretches   Other Lumbar Stretch Exercise trunk roation and side bending      Manual Therapy   Soft tissue mobilization ribcage mobility ,abdominal fascial release with C pelvis and bladder                     PT Education - 02/12/21 0842     Education Details Access Code: [email or web address]    Person(s) Educated Patient    Methods Explanation;Demonstration;Tactile cues;Verbal cues;Handout    Comprehension Verbalized understanding;Returned demonstration              PT Short Term Goals - 12/13/20 0841       PT SHORT TERM GOAL #1   Title ind with initial HEP    Status Achieved               PT Long Term Goals - 11/02/20 1212       PT LONG TERM GOAL #1   Title Pt will be able to hold bladder for at least 2 hours to reduce frequency of voiding    Time 12  Period Weeks    Status New    Target Date 01/24/21      PT LONG TERM GOAL #2   Title Pt will report improved reduction of urge to void by 50% decrease    Time 12    Period Weeks    Status New    Target Date 01/24/21      PT LONG TERM GOAL #3   Title Pt will demonstrate kegel for at least 10 sec hold for improved functional activities without leakage    Time 12    Period Weeks    Status New    Target Date 01/24/21      PT LONG TERM GOAL #4   Title Pt will be ind with advanced HEP to maintain improved function    Time 12    Period Weeks    Status New    Target Date 01/24/21                    Patient will benefit from skilled therapeutic intervention in order to improve the following deficits and impairments:     Visit Diagnosis: Muscle weakness (generalized)  Cramp and spasm     Problem List Patient Active Problem List   Diagnosis Date Noted   Abdominal obesity and metabolic syndrome 71/21/9758   Chronic diarrhea 07/25/2020   Urinary and fecal incontinence 07/25/2020   Prolapsed internal hemorrhoids, grade 2 w/ fecal soiling 06/21/2015   Loose stools - likely from  metformin 06/21/2015   OBESITY 10/24/2009   GERD 10/24/2009   IRRITABLE BOWEL SYNDROME DIARRHEA PREDOMINANT 05/12/2007   Dysphagia 05/12/2007    Jule Ser, PT 02/12/2021, 10:20 AM  [place] @ [place], [place], [address] Phone: [phone]   Fax:  [phone]  Name: Paige Johns MRN: [id number] Date of Birth: Jul 02, 1965

## 2021-02-20 ENCOUNTER — Ambulatory Visit: Payer: BC Managed Care – PPO | Admitting: Cardiology

## 2021-02-20 ENCOUNTER — Other Ambulatory Visit: Payer: Self-pay

## 2021-02-20 ENCOUNTER — Encounter: Payer: Self-pay | Admitting: Cardiology

## 2021-02-20 VITALS — BP 126/70 | HR 78 | Ht 65.0 in | Wt 225.4 lb

## 2021-02-20 DIAGNOSIS — E7800 Pure hypercholesterolemia, unspecified: Secondary | ICD-10-CM

## 2021-02-20 DIAGNOSIS — R079 Chest pain, unspecified: Secondary | ICD-10-CM | POA: Diagnosis not present

## 2021-02-20 MED ORDER — METOPROLOL TARTRATE 100 MG PO TABS: 100.0000 mg | ORAL_TABLET | Freq: Once | ORAL | 0 refills | Status: DC

## 2021-02-20 NOTE — Patient Instructions (Addendum)
Medication Instructions:  Your physician recommends that you continue on your current medications as directed. Please refer to the Current Medication list given to you today.  *If you need a refill on your cardiac medications before your next appointment, please call your pharmacy*  Testing/Procedures: Your physician has requested that you have an echocardiogram. Echocardiography is a painless test that uses sound waves to create images of your heart. It provides your doctor with information about the size and shape of your heart and how well your heart's chambers and valves are working. This procedure takes approximately one hour. There are no restrictions for this procedure.  You provider recommends that you have a coronary CTA. Please see below for further instructions.   Follow-Up: At [facility], you and your health needs are our priority.  As part of our continuing mission to provide you with exceptional heart care, we have created designated Provider Care Teams.  These Care Teams include your primary Cardiologist (physician) and Advanced Practice Providers (APPs -  Physician Assistants and Nurse Practitioners) who all work together to provide you with the care you need, when you need it.    Other Instructions   Your cardiac CT will be scheduled at:   [facility] [address] [place], [place] [address] [phone]  Please arrive at the [facility] main entrance (entrance A) of [facility] 30 minutes prior to test start time. You can use the FREE valet parking offered at the main entrance (encouraged to control the heart rate for the test) Proceed to the [facility] Radiology Department (first floor) to check-in and test prep.  Please follow these instructions carefully (unless otherwise directed):  On the Night Before the Test: Be sure to Drink plenty of water. Do not consume any caffeinated/decaffeinated beverages or chocolate 12 hours prior to your  test. Do not take any antihistamines 12 hours prior to your test.  On the Day of the Test: Drink plenty of water until 1 hour prior to the test. Do not eat any food 4 hours prior to the test. You may take your regular medications prior to the test.  Take metoprolol (Lopressor) two hours prior to test. FEMALES- please wear underwire-free bra if available, avoid dresses & tight clothing      After the Test: Drink plenty of water. After receiving IV contrast, you may experience a mild flushed feeling. This is normal. On occasion, you may experience a mild rash up to 24 hours after the test. This is not dangerous. If this occurs, you can take Benadryl 25 mg and increase your fluid intake. If you experience trouble breathing, this can be serious. If it is severe call 911 IMMEDIATELY. If it is mild, please call our office. If you take any of these medications: Glipizide/Metformin, Avandament, Glucavance, please do not take 48 hours after completing test unless otherwise instructed.  Please allow 2-4 weeks for scheduling of routine cardiac CTs. Some insurance companies require a pre-authorization which may delay scheduling of this test.   For non-scheduling related questions, please contact the cardiac imaging nurse navigator should you have any questions/concerns: Marchia Bond, Cardiac Imaging Nurse Navigator Gordy Clement, Cardiac Imaging Nurse Navigator [facility] Direct Office Dial: [phone]   For scheduling needs, including cancellations and rescheduling, please call [place], [phone].

## 2021-02-20 NOTE — Progress Notes (Addendum)
Cardiology CONSULT Note    Date:  02/20/2021   ID:  Paige Johns, DOB September 22, 1965, MRN [id number]  PCP:  Tillman Abide, FNP  Cardiologist:  Fransico Him, MD   Chief Complaint  Patient presents with   New Patient (Initial Visit)    Chest pain      History of Present Illness:  Paige Johns is a 55 y.o. female who is being seen today for the evaluation of chest pain at the request of Tillman Abide, [place].  This is a 55yo obese female with a hx of DM, esophageal spasm, fibromyalgia, GERD, HLD who is referred for evaluation of chest pain. Her chest pain has been occurring for a few months.  It is sporadic and nothing triggers it and nothing makes it better.  She is on a PPI which has not helped.  It feels like a pinching sensation along with pain under her axilla.  She had one episode of chest pressure but resolved on its own.  There is no radiation of the pain.  She is in menopause so she sweats a lot but not any worse when she gets the pain.  There is no nausea with the pain.  She does get DOE when walking up the steps.  She denies any LE edema, syncope or palpitations.  She has some problems with inner ear that makes her dizzy at times. She used to smoke but quit in 1991.  Her maternal GM had angina.    Past Medical History:  Diagnosis Date   Arthritis    Dermatofibrosarcoma protuberans    Diabetes mellitus    Esophageal spasm    Fibromyalgia    GERD (gastroesophageal reflux disease)    Hyperlipidemia    IBS (irritable bowel syndrome)    Insomnia    Iron deficiency anemia    Mouth sores    ulcers   Obesity     Past Surgical History:  Procedure Laterality Date   ABDOMINAL HYSTERECTOMY     CHOLECYSTECTOMY  2001   COLONOSCOPY  06/08/2003   normal   groin surgery Right    dermato fibrosarcoma protuberans   TONSILLECTOMY     UPPER GASTROINTESTINAL ENDOSCOPY  06/08/2003, 02/25/2011   2005 - normal 2012 - erosive esophagitis, 21 Fr dilation for dysphagia     Current Medications: Current Meds  Medication Sig   ALPRAZolam (XANAX) 0.25 MG tablet Take 1 tablet by mouth as needed.   betamethasone dipropionate 0.05 % cream Apply topically 2 (two) times daily as needed (Rash). Not for face or folds.   clindamycin (CLEOCIN) 150 MG capsule Take 150 mg by mouth 3 (three) times daily.   dicyclomine (BENTYL) 20 MG tablet TAKE 1 TABLET (20 MG TOTAL) BY MOUTH 3 (THREE) TIMES DAILY BEFORE MEALS.   esomeprazole (NEXIUM) 40 MG capsule Take 1 capsule (40 mg total) by mouth daily at 12 noon.   loratadine (CLARITIN) 10 MG tablet Take 10 mg by mouth daily.   meloxicam (MOBIC) 15 MG tablet Take 1 tablet by mouth daily.   MINIVELLE 0.1 MG/24HR patch Place 1 patch onto the skin 2 (two) times a week.   sitaGLIPtin-metformin (JANUMET) 50-1000 MG tablet Take 1 tablet by mouth 2 (two) times daily with a meal.   tiZANidine (ZANAFLEX) 4 MG tablet Take 4 mg by mouth as needed for muscle spasms.    Allergies:   Oxycodone-acetaminophen   Social History   Socioeconomic History   Marital status: Married    Spouse  name: Not on file   Number of children: 2   Years of education: Not on file   Highest education level: Not on file  Occupational History   Occupation: CSR    Employer: [employer]  Tobacco Use   Smoking status: Former    Types: Cigarettes    Quit date: 04/07/1989    Years since quitting: 31.8   Smokeless tobacco: Never  Substance and Sexual Activity   Alcohol use: No    Alcohol/week: 0.0 standard drinks   Drug use: No   Sexual activity: Not on file  Other Topics Concern   Not on file  Social History Narrative   Married 2 daughters + 3 granddaughters   Customer Service specialist [place]   Alcohol no   Tobacco no   Minimal caffeine   Social Determinants of Radio broadcast assistant Strain: Not on file  Food Insecurity: Not on file  Transportation Needs: Not on file  Physical Activity: Not on file  Stress: Not on file  Social  Connections: Not on file     Family History:  The patient's family history includes Ataxia in her mother; Atrial fibrillation in her mother; Breast cancer in her maternal aunt; CAD in her maternal grandmother; Diabetes in her mother and paternal grandfather; Heart disease in her maternal grandmother; Hypertension in her mother; Lung cancer in her father and maternal uncle.   ROS:   Please see the history of present illness.    ROS All other systems reviewed and are negative.  No flowsheet data found.  PHYSICAL EXAM:   VS:  BP 126/70   Pulse 78   Ht 5\' 5"  (1.651 m)   Wt 225 lb 6.4 oz (102.2 kg)   SpO2 98%   BMI 37.51 kg/m    GEN: Well nourished, well developed, in no acute distress  HEENT: normal  Neck: no JVD, carotid bruits, or masses Cardiac: RRR; norubs, or gallops,no edema.  Intact distal pulses bilaterally. 1/6 SM at RUSB Respiratory:  clear to auscultation bilaterally, normal work of breathing GI: soft, nontender, nondistended, + BS MS: no deformity or atrophy  Skin: warm and dry, no rash Neuro:  Alert and Oriented x 3, Strength and sensation are intact Psych: euthymic mood, full affect  Wt Readings from Last 3 Encounters:  02/20/21 225 lb 6.4 oz (102.2 kg)  01/23/21 223 lb (101.2 kg)  10/02/20 221 lb 9.6 oz (100.5 kg)      Studies/Labs Reviewed:   EKG:  EKG is ordered today.  The ekg ordered today demonstrates NSR  Recent Labs: 07/23/2020: ALT 61; BUN 8; Creatinine, Ser 0.72; Hemoglobin 11.9; Platelets 254.0; Potassium 4.1; Sodium 138; TSH 1.67   Lipid Panel No results found for: CHOL, TRIG, HDL, CHOLHDL, VLDL, LDLCALC, LDLDIRECT  Additional studies/ records that were reviewed today include:  OV notes from PCP    ASSESSMENT:    1. Chest pain of uncertain etiology   2. Pure hypercholesterolemia      PLAN:  In order of problems listed above:  Chest pain -her pain is atypical in nature with normal EKG -she does have CRfs including remote hx of  tobacco, HLD, DM and obesity -recommend coronary CTA to define coronary anatomy  2.  HLD -LDL goal < 100 but if she is found to have coronary Ca it will be < 70 -currently diet controlled  3.  Heart murmur -check 2D echo  Time Spent: 20 minutes total time of encounter, including 15 minutes  spent in face-to-face patient care on the date of this encounter. This time includes coordination of care and counseling regarding above mentioned problem list. Remainder of non-face-to-face time involved reviewing chart documents/testing relevant to the patient encounter and documentation in the medical record. I have independently reviewed documentation from referring provider  Medication Adjustments/Labs and Tests Ordered: Current medicines are reviewed at length with the patient today.  Concerns regarding medicines are outlined above.  Medication changes, Labs and Tests ordered today are listed in the Patient Instructions below.  There are no Patient Instructions on file for this visit.   Signed, Fransico Him, MD  02/20/2021 2:55 PM    [place], [place], [place]  [address] Phone: [phone]; Fax: [phone]

## 2021-02-20 NOTE — Addendum Note (Signed)
Addended by: Antonieta Iba on: 02/20/2021 03:12 PM   Modules accepted: Orders

## 2021-03-05 ENCOUNTER — Other Ambulatory Visit (HOSPITAL_COMMUNITY): Payer: Self-pay | Admitting: *Deleted

## 2021-03-05 ENCOUNTER — Telehealth: Payer: Self-pay | Admitting: Cardiology

## 2021-03-05 ENCOUNTER — Other Ambulatory Visit: Payer: Self-pay

## 2021-03-05 ENCOUNTER — Ambulatory Visit: Payer: BC Managed Care – PPO | Admitting: Physical Therapy

## 2021-03-05 ENCOUNTER — Telehealth (HOSPITAL_COMMUNITY): Payer: Self-pay | Admitting: *Deleted

## 2021-03-05 DIAGNOSIS — Z01812 Encounter for preprocedural laboratory examination: Secondary | ICD-10-CM

## 2021-03-05 DIAGNOSIS — R252 Cramp and spasm: Secondary | ICD-10-CM

## 2021-03-05 DIAGNOSIS — M6281 Muscle weakness (generalized): Secondary | ICD-10-CM | POA: Diagnosis not present

## 2021-03-05 MED ORDER — METOPROLOL TARTRATE 100 MG PO TABS: 100.0000 mg | ORAL_TABLET | Freq: Once | ORAL | 0 refills | Status: DC

## 2021-03-05 NOTE — Telephone Encounter (Signed)
Spoke with the patient and advised her that I have resent her medication into the correct pharmacy. Confirmed instructions for CT scan with the patient. She verbalized understanding.

## 2021-03-05 NOTE — Therapy (Signed)
[place] @ [place], [place], [address] Phone: [phone]   Fax:  [phone]  Physical Therapy Treatment  Patient Details  Name: Paige Johns MRN: [id number] Date of Birth: 07-12-1965 Referring Provider (PT): Gatha Mayer, MD   Encounter Date: 03/05/2021   PT End of Session - 03/05/21 1146     Visit Number 6    Date for PT Re-Evaluation 01/24/21    Authorization Type BCBS    PT Start Time 1102    PT Stop Time 1142    PT Time Calculation (min) 40 min    Activity Tolerance Patient tolerated treatment well    Behavior During Therapy [facility] for tasks assessed/performed             Past Medical History:  Diagnosis Date   Arthritis    Dermatofibrosarcoma protuberans    Diabetes mellitus    Esophageal spasm    Fibromyalgia    GERD (gastroesophageal reflux disease)    Hyperlipidemia    IBS (irritable bowel syndrome)    Insomnia    Iron deficiency anemia    Mouth sores    ulcers   Obesity     Past Surgical History:  Procedure Laterality Date   ABDOMINAL HYSTERECTOMY     CHOLECYSTECTOMY  2001   COLONOSCOPY  06/08/2003   normal   groin surgery Right    dermato fibrosarcoma protuberans   TONSILLECTOMY     UPPER GASTROINTESTINAL ENDOSCOPY  06/08/2003, 02/25/2011   2005 - normal 2012 - erosive esophagitis, 56 Fr dilation for dysphagia    There were no vitals filed for this visit.   Subjective Assessment - 03/05/21 1146     Subjective I am still leaking when I pull my pants down.  I can't get there fast enough    Patient Stated Goals Decrease leakage    Currently in Pain? No/denies                            Pelvic Floor Special Questions - 03/05/21 0001     Pelvic Floor Internal Exam pt identity confirmed and consent to do internal assessment was given    Exam Type Vaginal    Strength fair squeeze, definite lift    Strength # of seconds 5               [facility]  Adult PT Treatment/Exercise - 03/05/21 0001       Manual Therapy   Manual Therapy Myofascial release    Myofascial Release lower abdomen, scar tissue, bladder                       PT Short Term Goals - 12/13/20 0841       PT SHORT TERM GOAL #1   Title ind with initial HEP    Status Achieved               PT Long Term Goals - 03/05/21 1105       PT LONG TERM GOAL #1   Title Pt will be able to hold bladder for at least 2 hours to reduce frequency of voiding    Baseline I can go a couple hours but when I am drinking more water I am going more often    Status On-going      PT LONG TERM GOAL #2   Title Pt will report improved reduction of urge  to void by 50% decrease    Baseline last week the urgency was more but this week it is the same    Status On-going      PT LONG TERM GOAL #3   Title Pt will demonstrate kegel for at least 10 sec hold for improved functional activities without leakage    Baseline same                   Plan - 03/05/21 1138     Clinical Impression Statement Today's session focused on abdominal and bladder fascial release.  She has still not been having much progress with urge techniques.  Found a lot of fascial tension, tight scar tissue, and tenderness across the lower abdomen.  She responded well to fascial release.  Cupping was causingmore tenderness so just did manual fascial release today.  Pt was able to do 5 second hold kegel today.  Pt will benefit from skilled PT to continue to address soft tissue impairments for reduced leakage.    PT Treatment/Interventions ADLs/Self Care Home Management;Biofeedback;Cryotherapy;Electrical Stimulation;Moist Heat;Therapeutic activities;Therapeutic exercise;Neuromuscular re-education;Patient/family education;Manual techniques;Dry needling;Passive range of motion;Taping    PT Next Visit Plan DN #3 if needed; kegel endurance; ,MFR to lower abdomen; gluteal and core strength    PT Home  Exercise Plan Access Code: [email or web address]    Consulted and Agree with Plan of Care Patient             Patient will benefit from skilled therapeutic intervention in order to improve the following deficits and impairments:  Decreased coordination, Impaired tone, Increased muscle spasms, Decreased endurance, Decreased strength  Visit Diagnosis: Muscle weakness (generalized)  Cramp and spasm     Problem List Patient Active Problem List   Diagnosis Date Noted   Abdominal obesity and metabolic syndrome 02/01/2535   Chronic diarrhea 07/25/2020   Urinary and fecal incontinence 07/25/2020   Prolapsed internal hemorrhoids, grade 2 w/ fecal soiling 06/21/2015   Loose stools - likely from metformin 06/21/2015   OBESITY 10/24/2009   GERD 10/24/2009   IRRITABLE BOWEL SYNDROME DIARRHEA PREDOMINANT 05/12/2007   Dysphagia 05/12/2007    Camillo Flaming Fredrico Beedle, PT 03/05/2021, 11:49 AM  [facility] @ [place], [place], [address] Phone: [phone]   Fax:  [phone]  Name: Paige Johns MRN: [id number] Date of Birth: 11/26/1965

## 2021-03-05 NOTE — Telephone Encounter (Signed)
Reaching out to patient to offer assistance regarding upcoming cardiac imaging study; pt verbalizes understanding of appt date/time, parking situation and where to check in, pre-test NPO status and medications ordered, and verified current allergies; name and call back number provided for further questions should they arise  Gordy Clement RN Navigator Cardiac Imaging [facility] [phone] office [phone] cell  Patient to take 100mg  metoprolol tartrate two hours prior to cardiac CT scan.  She is aware to arrive at 8am for her 8:30am scan.

## 2021-03-05 NOTE — Telephone Encounter (Signed)
   Pt c/o medication issue:  1. Name of Medication: metoprolol tartrate (LOPRESSOR) 100 MG tablet   2. How are you currently taking this medication (dosage and times per day)? Patient has not taken yet  3. Are you having a reaction (difficulty breathing--STAT)?   4. What is your medication issue? Patient said that this medication is for her CT on Thursday. It was sent to the wrong pharmacy. It needs to go to the [id number] - [place], [place]  Her CT is Thursday

## 2021-03-06 ENCOUNTER — Ambulatory Visit: Payer: BC Managed Care – PPO | Admitting: Cardiology

## 2021-03-06 LAB — BASIC METABOLIC PANEL
BUN/Creatinine Ratio: 12 (ref 9–23)
BUN: 9 mg/dL (ref 6–24)
CO2: 21 mmol/L (ref 20–29)
Calcium: 9.6 mg/dL (ref 8.7–10.2)
Chloride: 101 mmol/L (ref 96–106)
Creatinine, Ser: 0.74 mg/dL (ref 0.57–1.00)
Glucose: 149 mg/dL — ABNORMAL HIGH (ref 70–99)
Potassium: 4.3 mmol/L (ref 3.5–5.2)
Sodium: 137 mmol/L (ref 134–144)
eGFR: 95 mL/min/{1.73_m2} (ref >59)

## 2021-03-07 ENCOUNTER — Ambulatory Visit (HOSPITAL_COMMUNITY)
Admission: RE | Admit: 2021-03-07 | Discharge: 2021-03-07 | Disposition: A | Discharge status: Home / Self Care | Payer: BC Managed Care – PPO | Source: Ambulatory Visit | Attending: Cardiology | Admitting: Cardiology

## 2021-03-07 ENCOUNTER — Other Ambulatory Visit: Payer: Self-pay

## 2021-03-07 DIAGNOSIS — E7800 Pure hypercholesterolemia, unspecified: Secondary | ICD-10-CM | POA: Insufficient documentation

## 2021-03-07 DIAGNOSIS — R079 Chest pain, unspecified: Secondary | ICD-10-CM | POA: Insufficient documentation

## 2021-03-07 DIAGNOSIS — Q2112 Patent foramen ovale: Secondary | ICD-10-CM

## 2021-03-07 MED ORDER — IOHEXOL 350 MG/ML SOLN
95.0000 mL | Freq: Once | INTRAVENOUS | Status: AC | PRN
  Administered 2021-03-07: 95 mL via INTRAVENOUS

## 2021-03-07 MED ORDER — NITROGLYCERIN 0.4 MG SL SUBL
0.8000 mg | SUBLINGUAL_TABLET | Freq: Once | SUBLINGUAL | Status: AC
  Administered 2021-03-07: 0.8 mg via SUBLINGUAL

## 2021-03-07 MED ORDER — NITROGLYCERIN 0.4 MG SL SUBL
SUBLINGUAL_TABLET | SUBLINGUAL | Status: AC
  Filled 2021-03-07: qty 2

## 2021-03-08 ENCOUNTER — Encounter: Payer: Self-pay | Admitting: Cardiology

## 2021-03-08 DIAGNOSIS — I2510 Atherosclerotic heart disease of native coronary artery without angina pectoris: Secondary | ICD-10-CM | POA: Insufficient documentation

## 2021-03-08 DIAGNOSIS — Q2112 Patent foramen ovale: Secondary | ICD-10-CM | POA: Insufficient documentation

## 2021-03-13 ENCOUNTER — Ambulatory Visit (HOSPITAL_COMMUNITY): Payer: BC Managed Care – PPO | Attending: Cardiology

## 2021-03-13 ENCOUNTER — Encounter: Payer: Self-pay | Admitting: Dermatology

## 2021-03-13 ENCOUNTER — Encounter: Payer: Self-pay | Admitting: Physical Therapy

## 2021-03-13 ENCOUNTER — Other Ambulatory Visit: Payer: Self-pay

## 2021-03-13 ENCOUNTER — Telehealth: Payer: Self-pay

## 2021-03-13 ENCOUNTER — Ambulatory Visit: Payer: BC Managed Care – PPO | Admitting: Dermatology

## 2021-03-13 ENCOUNTER — Ambulatory Visit: Payer: BC Managed Care – PPO | Attending: Internal Medicine | Admitting: Physical Therapy

## 2021-03-13 DIAGNOSIS — L304 Erythema intertrigo: Secondary | ICD-10-CM | POA: Diagnosis not present

## 2021-03-13 DIAGNOSIS — L821 Other seborrheic keratosis: Secondary | ICD-10-CM

## 2021-03-13 DIAGNOSIS — M6281 Muscle weakness (generalized): Secondary | ICD-10-CM | POA: Insufficient documentation

## 2021-03-13 DIAGNOSIS — E7800 Pure hypercholesterolemia, unspecified: Secondary | ICD-10-CM | POA: Insufficient documentation

## 2021-03-13 DIAGNOSIS — R079 Chest pain, unspecified: Secondary | ICD-10-CM | POA: Diagnosis present

## 2021-03-13 DIAGNOSIS — D1801 Hemangioma of skin and subcutaneous tissue: Secondary | ICD-10-CM | POA: Diagnosis not present

## 2021-03-13 DIAGNOSIS — L609 Nail disorder, unspecified: Secondary | ICD-10-CM

## 2021-03-13 DIAGNOSIS — Z1283 Encounter for screening for malignant neoplasm of skin: Secondary | ICD-10-CM

## 2021-03-13 DIAGNOSIS — Z01812 Encounter for preprocedural laboratory examination: Secondary | ICD-10-CM

## 2021-03-13 DIAGNOSIS — L820 Inflamed seborrheic keratosis: Secondary | ICD-10-CM

## 2021-03-13 DIAGNOSIS — R252 Cramp and spasm: Secondary | ICD-10-CM | POA: Insufficient documentation

## 2021-03-13 LAB — ECHOCARDIOGRAM COMPLETE
AR max vel: 2.33 cm2
AV Area VTI: 2.39 cm2
AV Area mean vel: 2.33 cm2
AV Mean grad: 10 mmHg
AV Peak grad: 17.5 mmHg
Ao pk vel: 2.09 m/s
Area-P 1/2: 3.99 cm2
P 1/2 time: 450 msec
S' Lateral: 2.8 cm

## 2021-03-13 MED ORDER — ASPIRIN EC 81 MG PO TBEC: 81.0000 mg | DELAYED_RELEASE_TABLET | Freq: Every day | ORAL | 3 refills | Status: AC

## 2021-03-13 NOTE — Telephone Encounter (Signed)
The patient has been notified of the result and verbalized understanding.  All questions (if any) were answered. Antonieta Iba, RN 03/13/2021 4:59 PM  Patient had an echocardiogram done today. She is going to go to [facility] to have her cholesterol checked. She will start on ASA 81 mg daily.

## 2021-03-13 NOTE — Patient Instructions (Signed)
Overt the counter triple paste AF for under the breast daily.

## 2021-03-13 NOTE — Therapy (Signed)
[facility] @ [address] [place], [place], [address] Phone: [phone]   Fax:  [phone]  Physical Therapy Treatment  Patient Details  Name: Paige Johns MRN: [id number] Date of Birth: 1965/04/15 Referring Provider (PT): Gatha Mayer, MD   Encounter Date: 03/13/2021   PT End of Session - 03/13/21 1047     Visit Number 7    Date for PT Re-Evaluation 01/24/21    Authorization Type BCBS    PT Start Time 1015    PT Stop Time 1054    PT Time Calculation (min) 39 min    Activity Tolerance Patient tolerated treatment well    Behavior During Therapy [facility] for tasks assessed/performed             Past Medical History:  Diagnosis Date   Arthritis    CAD (coronary artery disease), native coronary artery    coronary CTA 03/2021 with Minimal calcified plaque in the mid LAD < 25%.   Dermatofibrosarcoma protuberans    Diabetes mellitus    Esophageal spasm    Fibromyalgia    GERD (gastroesophageal reflux disease)    Hyperlipidemia    IBS (irritable bowel syndrome)    Insomnia    Iron deficiency anemia    Mouth sores    ulcers   Obesity    PFO (patent foramen ovale)    noted on coronary CTA 03/2021    Past Surgical History:  Procedure Laterality Date   ABDOMINAL HYSTERECTOMY     CHOLECYSTECTOMY  2001   COLONOSCOPY  06/08/2003   normal   groin surgery Right    dermato fibrosarcoma protuberans   TONSILLECTOMY     UPPER GASTROINTESTINAL ENDOSCOPY  06/08/2003, 02/25/2011   2005 - normal 2012 - erosive esophagitis, 54 Fr dilation for dysphagia    There were no vitals filed for this visit.   Subjective Assessment - 03/13/21 1059     Subjective about the same    Currently in Pain? No/denies                               [facility] Adult PT Treatment/Exercise - 03/13/21 0001       Self-Care   Self-Care Other Self-Care Comments    Other Self-Care Comments  final HEP and stretching vaginal  canal with dsert harvest sample      Lumbar Exercises: Stretches   Other Lumbar Stretch Exercise butterfly, rotation stretches - educated and added to HEP      Manual Therapy   Manual Therapy Soft tissue mobilization;Internal Pelvic Floor    Manual therapy comments identity and consent for internal soft tissue mobs    Soft tissue mobilization adductors    Internal Pelvic Floor levators and vaginal cana fascial release - very TTP              Trigger Point Dry Needling - 03/13/21 0001     Muscles Treated Lower Quadrant Adductor longus/brevis/magnus    Adductor Response Twitch response elicited;Palpable increased muscle length   bil                  PT Education - 03/13/21 1042     Education Details stretch pelvic floor    Person(s) Educated Patient    Methods Explanation;Demonstration;Tactile cues;Verbal cues;Handout    Comprehension Verbalized understanding;Returned demonstration              PT Short Term Goals -  12/13/20 0841       PT SHORT TERM GOAL #1   Title ind with initial HEP    Status Achieved               PT Long Term Goals - 03/13/21 1058       PT LONG TERM GOAL #1   Title Pt will be able to hold bladder for at least 2 hours to reduce frequency of voiding    Status Not Met      PT LONG TERM GOAL #2   Title Pt will report improved reduction of urge to void by 50% decrease    Status Not Met      PT LONG TERM GOAL #3   Title Pt will demonstrate kegel for at least 10 sec hold for improved functional activities without leakage    Status Not Met      PT LONG TERM GOAL #4   Title Pt will be ind with advanced HEP to maintain improved function    Status Achieved                   Plan - 03/13/21 1056     Clinical Impression Statement Pt will discharge with HEP today.  Pt is not feeling much different and there is a lot of inflammation in the pelvic floor tissues.  She was educated on ways to gently stretch and relax.  Pt  has a lot going on  in her life and may want to return to pelvic PT in the future but at this time is recommended to d/c with HEP    PT Treatment/Interventions ADLs/Self Care Home Management;Biofeedback;Cryotherapy;Electrical Stimulation;Moist Heat;Therapeutic activities;Therapeutic exercise;Neuromuscular re-education;Patient/family education;Manual techniques;Dry needling;Passive range of motion;Taping    PT Next Visit Plan d/c today    PT Home Exercise Plan Access Code: [email or web address]    Consulted and Agree with Plan of Care Patient             Patient will benefit from skilled therapeutic intervention in order to improve the following deficits and impairments:  Decreased coordination, Impaired tone, Increased muscle spasms, Decreased endurance, Decreased strength  Visit Diagnosis: Muscle weakness (generalized)  Cramp and spasm     Problem List Patient Active Problem List   Diagnosis Date Noted   CAD (coronary artery disease), native coronary artery 03/08/2021   PFO (patent foramen ovale) 03/08/2021   Abdominal obesity and metabolic syndrome 15/52/0802   Chronic diarrhea 07/25/2020   Urinary and fecal incontinence 07/25/2020   Prolapsed internal hemorrhoids, grade 2 w/ fecal soiling 06/21/2015   Loose stools - likely from metformin 06/21/2015   OBESITY 10/24/2009   GERD 10/24/2009   IRRITABLE BOWEL SYNDROME DIARRHEA PREDOMINANT 05/12/2007   Dysphagia 05/12/2007    Camillo Flaming Jaaziah Schulke, PT 03/13/2021, 10:59 AM  [facility] @ [place] [place], [place], [address] Phone: [phone]   Fax:  [phone]  Name: Paige Johns MRN: [id number] Date of Birth: 1965-05-06   PHYSICAL THERAPY DISCHARGE SUMMARY  Visits from Start of Care: 7  Current functional level related to goals / functional outcomes:   See above goals Remaining deficits: See above details   Education / Equipment: HEP  Patient agrees to  discharge. Patient goals were not met. Patient is being discharged due to lack of progress.  Gustavus Bryant, PT 03/13/21 11:00 AM

## 2021-03-13 NOTE — Patient Instructions (Signed)
STRETCHING THE PELVIC FLOOR MUSCLES NO DILATOR  Supplies Vaginal lubricant Mirror (optional) Gloves (optional) or clean hands Positioning Start in a semi-reclined position with your head propped up. Bend your knees and place your thumb or finger at the vaginal opening. Procedure Apply a moderate amount of lubricant on the outer skin of your vagina, the labia minora.  Apply additional lubricant to your finger. Spread the skin away from the vaginal opening. Place the end of your finger at the opening. Do a maximum contraction of the pelvic floor muscles. Tighten the vagina and the anus maximally and relax. When you know they are relaxed, gently and slowly insert your finger into your vagina, directing your finger slightly downward, for 2-3 inches of insertion. Relax and stretch the 6 o'clock position Hold each stretch for _30-60 seconds, no pain more than 3/10 Repeat the stretching in the 4 o'clock and 8 o'clock positions. Next gently move your finger in a "U" shape  several times.  You can also enter a second finger to work to spread the vaginal opening wider from 3:00-6:00 and 6:00-9:00 or 3:00-9:00 Perform daily or every other day Once you have accomplished the techniques you may try them in standing with one foot resting on the tub, or in other positions.  This is a good stretch to do in the shower if you don't need to use lubricant.

## 2021-03-13 NOTE — Telephone Encounter (Signed)
-----   Message from Paige Margarita, MD sent at 03/08/2021 10:07 AM EST ----- Minimal calcified plaque in the mid LAD < 25%.  PFO is present.  Please have patient come in for FLP and ALT.  Add ASA 81mg  daily. Recommend stopping minivelle patch given CAD.  Please get 2D echo to assess for PFO

## 2021-03-18 ENCOUNTER — Ambulatory Visit: Payer: BC Managed Care – PPO | Admitting: Cardiology

## 2021-03-19 ENCOUNTER — Encounter: Payer: Self-pay | Admitting: Dermatology

## 2021-03-20 ENCOUNTER — Encounter: Payer: Self-pay | Admitting: Cardiology

## 2021-03-23 LAB — LIPID PANEL
Chol/HDL Ratio: 4.2 ratio (ref 0.0–4.4)
Cholesterol, Total: 189 mg/dL (ref 100–199)
HDL: 45 mg/dL (ref >39)
LDL Chol Calc (NIH): 117 mg/dL — ABNORMAL HIGH (ref 0–99)
Triglycerides: 150 mg/dL — ABNORMAL HIGH (ref 0–149)
VLDL Cholesterol Cal: 27 mg/dL (ref 5–40)

## 2021-03-23 LAB — ALT: ALT: 59 IU/L — ABNORMAL HIGH (ref 0–32)

## 2021-04-01 ENCOUNTER — Encounter: Payer: Self-pay | Admitting: Dermatology

## 2021-04-01 NOTE — Progress Notes (Signed)
° °  Follow-Up Visit   Subjective  Paige Johns is a 55 y.o. female who presents for the following: Annual Exam (Left upper back lesion she is picking ).  General skin examination, 1 spot on her back left shoulder is bothersome Location:  Duration:  Quality:  Associated Signs/Symptoms: Modifying Factors:  Severity:  Timing: Context:   Objective  Well appearing patient in no apparent distress; mood and affect are within normal limits. Head Full body skin check no history of atypical moles or skin cancer. Many keratoses and angiomas, left upper thigh blood vessel with change per patient. Toenail color change due to ingrown nail cut out.  Dermoscopy of left thigh shows benign angioma, will return if there is continued growth or bleeding.  Torso - Posterior (Back) Keratoses on the back several follow-up which have been picked.  Left Upper Back Inflamed keratosis perhaps secondary to picking, we will see if it will go away with LN2 freeze.  Left Inframammary Fold Erythema without margination, pustules, or satellite lesions compatible with irritant dermatitis.  She is previously used silver gold Veleta Miners with some success but is uncertain if she could again obtain this.    A full examination was performed including scalp, head, eyes, ears, nose, lips, neck, chest, axillae, abdomen, back, buttocks, bilateral upper extremities, bilateral lower extremities, hands, feet, fingers, toes, fingernails, and toenails. All findings within normal limits unless otherwise noted below.  Areas beneath undergarments not fully examined.   Assessment & Plan    Screening exam for skin cancer Head  Keep yearly skin checks   Seborrheic keratosis Torso - Posterior (Back)  Stable and safe to leave   Seborrheic keratosis, inflamed Left Upper Back  Destruction of lesion - Left Upper Back Complexity: simple   Destruction method: cryotherapy   Informed consent: discussed and consent obtained    Timeout:  patient name, date of birth, surgical site, and procedure verified Lesion destroyed using liquid nitrogen: Yes   Cryotherapy cycles:  3 Outcome: patient tolerated procedure well with no complications   Post-procedure details: wound care instructions given    Intertrigo Left Inframammary Fold  Over the counter triple paste AF, Dr Denna Haggard will refill the Lavella Hammock Goo paste if the otc doesn't work      I, Paige Monarch, MD, have reviewed all documentation for this visit.  The documentation on 04/01/21 for the exam, diagnosis, procedures, and orders are all accurate and complete.

## 2021-11-04 ENCOUNTER — Encounter: Payer: Self-pay | Admitting: Dermatology

## 2022-03-17 ENCOUNTER — Ambulatory Visit: Payer: BC Managed Care – PPO | Admitting: Dermatology

## 2023-09-17 ENCOUNTER — Encounter: Payer: Self-pay | Admitting: Gastroenterology

## 2023-11-02 NOTE — Progress Notes (Unsigned)
 REMI RESTER [id number] 1965-06-01   Chief Complaint:  Referring Provider: Mikle Beau, FNP Primary GI MD: Dr. Avram  HPI: Paige Johns is a 58 y.o. female with past medical history of arthritis, CAD, T2DM, fibromyalgia, GERD, IBS, HLD, iron deficiency anemia, obesity, PFO, hysterectomy, cholecystectomy, internal hemorrhoids s/p banding who presents today for a complaint of GERD .    Patient last seen in office 01/23/2021 by Dr. Avram for follow-up of IBS, GERD, NAFLD, pelvic floor dysfunction.  Has previously seen pelvic floor physical therapy.  Has previously been on omeprazole  and pantoprazole, and at last visit was switched to esomeprazole  40 mg daily.  She had been having some substernal chest pain radiating to the left which was not associated with exertion or movement, and not typical of her usual GERD symptoms.  Cardiac evaluation recommended.   Previous GI Procedures/Imaging   Colonoscopy 09/04/2015 - The entire examined colon is normal.  - No specimens collected.  - She does have a history of prolapsed hemorrhoids not seen as expected on insufflated exam today - Recall 10 years  EGD 09/04/2015 - No endoscopic esophageal abnormality to explain patient' s dysphagia. Esophagus dilated. Dilated.  - The examination was otherwise normal.  - No specimens collected.  EGD 02/25/2011 - Erosions, multiple in the distal esophagus - Otherwise normal examination-54 [place] Maloney dilation performed due to dysphagia  Past Medical History:  Diagnosis Date   Arthritis    CAD (coronary artery disease), native coronary artery    coronary CTA 03/2021 with Minimal calcified plaque in the mid LAD < 25%.   Dermatofibrosarcoma protuberans    Diabetes mellitus    Esophageal spasm    Fibromyalgia    GERD (gastroesophageal reflux disease)    Hyperlipidemia    IBS (irritable bowel syndrome)    Insomnia    Iron deficiency anemia    Mouth sores    ulcers   Obesity     PFO (patent foramen ovale)    noted on coronary CTA 03/2021    Past Surgical History:  Procedure Laterality Date   ABDOMINAL HYSTERECTOMY     CHOLECYSTECTOMY  2001   COLONOSCOPY  06/08/2003   normal   groin surgery Right    dermato fibrosarcoma protuberans   TONSILLECTOMY     UPPER GASTROINTESTINAL ENDOSCOPY  06/08/2003, 02/25/2011   2005 - normal 2012 - erosive esophagitis, 54 Fr dilation for dysphagia    Current Outpatient Medications  Medication Sig Dispense Refill   ALPRAZolam (XANAX) 0.25 MG tablet Take 1 tablet by mouth as needed.     aspirin  EC 81 MG tablet Take 1 tablet (81 mg total) by mouth daily. Swallow whole. 90 tablet 3   betamethasone  dipropionate 0.05 % cream Apply topically 2 (two) times daily as needed (Rash). Not for face or folds. 45 g 3   clindamycin (CLEOCIN) 150 MG capsule Take 150 mg by mouth 3 (three) times daily.     dicyclomine  (BENTYL ) 20 MG tablet TAKE 1 TABLET (20 MG TOTAL) BY MOUTH 3 (THREE) TIMES DAILY BEFORE MEALS. 270 tablet 0   esomeprazole  (NEXIUM ) 40 MG capsule Take 1 capsule (40 mg total) by mouth daily at 12 noon. 90 capsule 3   loratadine (CLARITIN) 10 MG tablet Take 10 mg by mouth daily.     meloxicam (MOBIC) 15 MG tablet Take 1 tablet by mouth daily.     metoprolol  tartrate (LOPRESSOR ) 100 MG tablet Take 1 tablet (100 mg total) by mouth once for 1 dose. Take  1 tablet two hours prior to CT scan. 1 tablet 0   MINIVELLE 0.1 MG/24HR patch Place 1 patch onto the skin 2 (two) times a week.     sitaGLIPtin-metformin (JANUMET) 50-1000 MG tablet Take 1 tablet by mouth 2 (two) times daily with a meal.     tiZANidine (ZANAFLEX) 4 MG tablet Take 4 mg by mouth as needed for muscle spasms.     No current facility-administered medications for this visit.    Allergies as of 11/03/2023 - Review Complete 04/01/2021  Allergen Reaction Noted   Oxycodone-acetaminophen Rash     Family History  Problem Relation Age of Onset   Diabetes Mother    Hypertension  Mother    Atrial fibrillation Mother    Ataxia Mother    Lung cancer Father    Breast cancer Maternal Aunt        in 60's   Lung cancer Maternal Uncle    Heart disease Maternal Grandmother    CAD Maternal Grandmother    Diabetes Paternal Grandfather    Colon cancer Neg Hx     Social History   Tobacco Use   Smoking status: Former    Current packs/day: 0.00    Types: Cigarettes    Quit date: 04/07/1989    Years since quitting: 34.5   Smokeless tobacco: Never  Vaping Use   Vaping status: Never Used  Substance Use Topics   Alcohol use: No    Alcohol/week: 0.0 standard drinks of alcohol   Drug use: No     Review of Systems:    Constitutional: No weight loss, fever, chills, weakness or fatigue Eyes: No change in vision Ears, Nose, Throat:  No change in hearing or congestion Skin: No rash or itching Cardiovascular: No chest pain, chest pressure or palpitations   Respiratory: No SOB or cough Gastrointestinal: See HPI and otherwise negative Genitourinary: No dysuria or change in urinary frequency Neurological: No headache, dizziness or syncope Musculoskeletal: No new muscle or joint pain Hematologic: No bleeding or bruising    Physical Exam:  Vital signs: There were no vitals taken for this visit.  Constitutional: NAD, Well developed, Well nourished, alert and cooperative Head:  Normocephalic and atraumatic.  Eyes: No scleral icterus. Conjunctiva pink. Mouth: No oral lesions. Respiratory: Respirations even and unlabored. Lungs clear to auscultation bilaterally.  No wheezes, crackles, or rhonchi.  Cardiovascular:  Regular rate and rhythm. No murmurs. No peripheral edema. Gastrointestinal:  Soft, nondistended, nontender. No rebound or guarding. Normal bowel sounds. No appreciable masses or hepatomegaly. Rectal:  Not performed.  Neurologic:  Alert and oriented x4;  grossly normal neurologically.  Skin:   Dry and intact without significant lesions or rashes. Psychiatric:  Oriented to person, place and time. Demonstrates good judgement and reason without abnormal affect or behaviors.   RELEVANT LABS AND IMAGING: CBC    Component Value Date/Time   WBC 9.1 07/23/2020 1005   RBC 5.00 07/23/2020 1005   HGB 11.9 (L) 07/23/2020 1005   HCT 36.9 07/23/2020 1005   PLT 254.0 07/23/2020 1005   MCV 73.9 Repeated and verified X2. (L) 07/23/2020 1005   MCHC 32.3 07/23/2020 1005   RDW 16.8 (H) 07/23/2020 1005   LYMPHSABS 2.2 07/23/2020 1005   MONOABS 0.5 07/23/2020 1005   EOSABS 0.2 07/23/2020 1005   BASOSABS 0.1 07/23/2020 1005    CMP     Component Value Date/Time   NA 137 03/05/2021 1316   K 4.3 03/05/2021 1316   CL 101 03/05/2021 1316  CO2 21 03/05/2021 1316   GLUCOSE 149 (H) 03/05/2021 1316   GLUCOSE 192 (H) 07/23/2020 1005   BUN 9 03/05/2021 1316   CREATININE 0.74 03/05/2021 1316   CALCIUM 9.6 03/05/2021 1316   PROT 7.7 07/23/2020 1005   ALBUMIN 4.1 07/23/2020 1005   AST 52 (H) 07/23/2020 1005   ALT 59 (H) 03/22/2021 0839   ALKPHOS 72 07/23/2020 1005   BILITOT 0.5 07/23/2020 1005   GFRNONAA 84 01/21/2007 0932   GFRAA 102 01/21/2007 0932   Echocardiogram 03/13/2021 1. Left ventricular ejection fraction, by estimation, is 60 to 65% . The left ventricle has normal function. The left ventricle has no regional wall motion abnormalities. There is mild left ventricular hypertrophy. Left ventricular diastolic parameters are consistent with Grade I diastolic dysfunction ( impaired relaxation) . The average left ventricular global longitudinal strain is - 19. 1 % . The global longitudinal strain is normal. 2. Right ventricular systolic function is normal. The right ventricular size is normal. Tricuspid regurgitation signal is inadequate for assessing PA pressure.  3. The mitral valve is normal in structure. Trivial mitral valve regurgitation.  4. The aortic valve was not well visualized. Aortic valve regurgitation is mild.  5. The inferior vena cava is normal  in size with greater than 50% respiratory variability, suggesting right atrial pressure of 3 mmHg.  Assessment/Plan:       Camie Furbish, PA-C [facility] 11/02/2023, 7:27 PM  Patient Care Team: Mikle Beau, FNP as PCP - General (Family Medicine) Livingston Rigg, MD as Consulting Physician (Dermatology)

## 2023-11-03 ENCOUNTER — Encounter: Payer: Self-pay | Admitting: Internal Medicine

## 2023-11-03 ENCOUNTER — Telehealth: Payer: Self-pay

## 2023-11-03 ENCOUNTER — Ambulatory Visit: Admitting: Gastroenterology

## 2023-11-03 ENCOUNTER — Encounter: Payer: Self-pay | Admitting: Gastroenterology

## 2023-11-03 VITALS — BP 118/66 | HR 76 | Ht 65.5 in | Wt 223.0 lb

## 2023-11-03 DIAGNOSIS — K219 Gastro-esophageal reflux disease without esophagitis: Secondary | ICD-10-CM

## 2023-11-03 DIAGNOSIS — D649 Anemia, unspecified: Secondary | ICD-10-CM

## 2023-11-03 DIAGNOSIS — K580 Irritable bowel syndrome with diarrhea: Secondary | ICD-10-CM | POA: Diagnosis not present

## 2023-11-03 DIAGNOSIS — R1319 Other dysphagia: Secondary | ICD-10-CM

## 2023-11-03 DIAGNOSIS — K582 Mixed irritable bowel syndrome: Secondary | ICD-10-CM

## 2023-11-03 MED ORDER — OMEPRAZOLE 40 MG PO CPDR: 40.0000 mg | DELAYED_RELEASE_CAPSULE | Freq: Two times a day (BID) | ORAL | 3 refills | Status: AC

## 2023-11-03 NOTE — Telephone Encounter (Signed)
 See the message from the pt regarding iron

## 2023-11-03 NOTE — Patient Instructions (Addendum)
 You have been scheduled for an endoscopy. Please follow written instructions given to you at your visit today.  If you use inhalers (even only as needed), please bring them with you on the day of your procedure.  If you take any of the following medications, they will need to be adjusted prior to your procedure:   DO NOT TAKE 7 DAYS PRIOR TO TEST- Trulicity (dulaglutide) Ozempic, Wegovy (semaglutide) Mounjaro (tirzepatide) Bydureon Bcise (exanatide extended release)  DO NOT TAKE 1 DAY PRIOR TO YOUR TEST Rybelsus (semaglutide) Adlyxin (lixisenatide) Victoza (liraglutide) Byetta (exanatide) ___________________________________________________________________________  We have sent the following medications to your pharmacy for you to pick up at your convenience: Omeprazole  40 mg twice a day  _______________________________________________________  If your blood pressure at your visit was 140/90 or greater, please contact your primary care physician to follow up on this.  _______________________________________________________  If you are age 58 or older, your body mass index should be between 23-30. Your Body mass index is 36.54 kg/m. If this is out of the aforementioned range listed, please consider follow up with your Primary Care Provider.  If you are age 58 or younger, your body mass index should be between 19-25. Your Body mass index is 36.54 kg/m. If this is out of the aformentioned range listed, please consider follow up with your Primary Care Provider.   ________________________________________________________  The [facility] providers would like to encourage you to use MYCHART to communicate with providers for non-urgent requests or questions.  Due to long hold times on the telephone, sending your provider a message by [facility] may be a faster and more efficient way to get a response.  Please allow 48 business hours for a response.  Please remember that this is for non-urgent  requests.  _______________________________________________________  [facility] is using a team-based approach to care.  Your team is made up of your doctor and two to three APPS. Our APPS (Nurse Practitioners and Physician Assistants) work with your physician to ensure care continuity for you. They are fully qualified to address your health concerns and develop a treatment plan. They communicate directly with your gastroenterologist to care for you. Seeing the Advanced Practice Practitioners on your physician's team can help you by facilitating care more promptly, often allowing for earlier appointments, access to diagnostic testing, procedures, and other specialty referrals.

## 2023-11-03 NOTE — Telephone Encounter (Signed)
 Cardiac clearance letter faxed to Dr Lillia Blacksmith at [facility] in [place], [place]. Fax # [phone]. Phone # [phone].

## 2023-11-06 ENCOUNTER — Telehealth: Payer: Self-pay | Admitting: Gastroenterology

## 2023-11-06 NOTE — Telephone Encounter (Signed)
 Corean, did we send a request for records of patient's cardiac stress test and carotid artery ultrasound?

## 2023-11-06 NOTE — Telephone Encounter (Signed)
 I had to email her the form to sign as she lives in [place]. I'm waiting for her to email it back to me before I fax it.

## 2023-11-10 ENCOUNTER — Telehealth: Payer: Self-pay

## 2023-11-10 NOTE — Telephone Encounter (Signed)
-----   Message from Camie FORBES Furbish sent at 11/10/2023  8:53 AM EDT ----- Regarding: FW: Cardiac clearance Corean,  Please see below from Norleen Schillings. I think we may need to request cardiac clearance for procedure, even if that means sending urgent referral for patient to have evaluation. If they can provide written evaluation of the tests she had done we may be able to avoid this.  I'm a little confused about her situation but recall that when you reached out to cardiology they said they had not seen her in office.  Could you please reach out to patient and see if we can get some clarification on this?  Thank you! ----- Message ----- From: Schillings Norleen, CRNA Sent: 11/10/2023   6:24 AM EDT To: Camie FORBES Furbish, PA-C Subject: RE: Cardiac clearance                          Camie,  We need cardiology to provide a written eval of the tests they ordered to r/o cardiac etiology for her CP prior to proceeding at [facility].  Regards,  Norleen EMERSON Schillings ----- Message ----- From: Furbish Camie FORBES DEVONNA Sent: 11/09/2023   1:40 PM EDT To: Norleen Schillings, CRNA Subject: Cardiac clearance                              Good afternoon,  Wanted to discuss a patient with you who is scheduled for EGD in the [facility] on 8/28 with Dr. Avram. At my visit with her she reported having recent workup for chest pain to include cardiac stress test and carotid ultrasound which she says were normal, and was told her chest pain was likely related to GERD.  We requested cardiac clearance, but cardiology denied seeing her and said she just had tests done.  We are working to get the results sent over (believe she had them done in [place] ).  If we can get these records and they are within normal limits as she says, is that enough to clear her for procedure? Or is this even necessary? Thank you! Camie Furbish

## 2023-11-10 NOTE — Telephone Encounter (Signed)
 Pt reports she saw a cardiologist in [place] many years ago. She states if she does need a referral she would like to go to Dr Hervey since he is in [place] and she wont have to travel far. Let me know if and when you would like the referral placed. ROI has been faxed to Dr Richelle office to get test results she had done recently, waiting for response.

## 2023-11-13 ENCOUNTER — Other Ambulatory Visit: Payer: Self-pay

## 2023-11-13 DIAGNOSIS — I2510 Atherosclerotic heart disease of native coronary artery without angina pectoris: Secondary | ICD-10-CM

## 2023-11-13 NOTE — Telephone Encounter (Signed)
 Referral placed.

## 2023-11-16 NOTE — Telephone Encounter (Signed)
 Pt informed referral has been placed and their office will reach out to schedule an appointment.

## 2023-11-16 NOTE — Telephone Encounter (Signed)
 Referral to Dr Rosalba placed on 11/13/23 for preop clearance and pt informed their office will call to schedule an appt.

## 2023-11-20 NOTE — Telephone Encounter (Signed)
 Inbound call from patient requesting to speak with Paige Johns, she states that she seen cardiologist today and she has been cleared to have EGD procedure. Please advise.

## 2023-11-20 NOTE — Telephone Encounter (Signed)
 Clearance letter faxed to Dr Rosalba.

## 2023-11-23 IMAGING — CT CT HEART MORP W/ CTA COR W/ SCORE W/ CA W/CM &/OR W/O CM
4 of 8 series · 8 of 20 positions shown, 9 images · non-contrast
Comparison: None.

Addendum:
CLINICAL DATA: Chest pain

EXAM:
Cardiac CTA
MEDICATIONS:
Sub lingual nitro. 4mg and lopressor 100mg
TECHNIQUE: The patient was scanned on a Siemens Force [REDACTED]ice scanner. Gantry
rotation speed was 250 msecs. Collimation was .6 mm. A 100 kV
prospective scan was triggered in the ascending thoracic aorta at
140 HU's Full mA was used between 35% and 75% of the R-R interval.
Average HR during the scan was 64 bpm. The 3D data set was
interpreted on a dedicated work station using MPR, MIP and VRT
modes. A total of 80cc of contrast was used.

[Series 7: ts diast sharp · axial · 0.39mm/px · z∈[+1147,+1185]mm · 2 of 283 slices shown]
[im 95/283  lung]
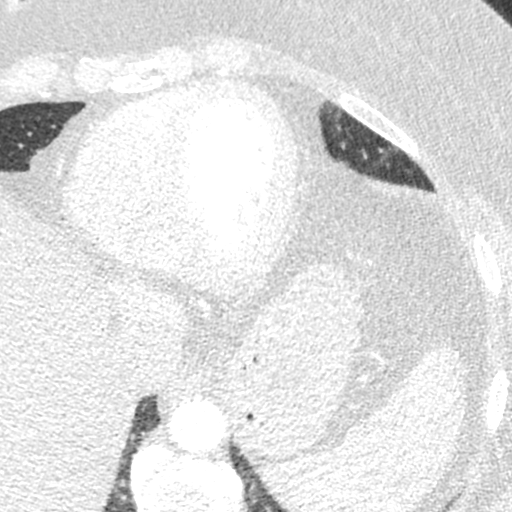
[im 189/283  lung]
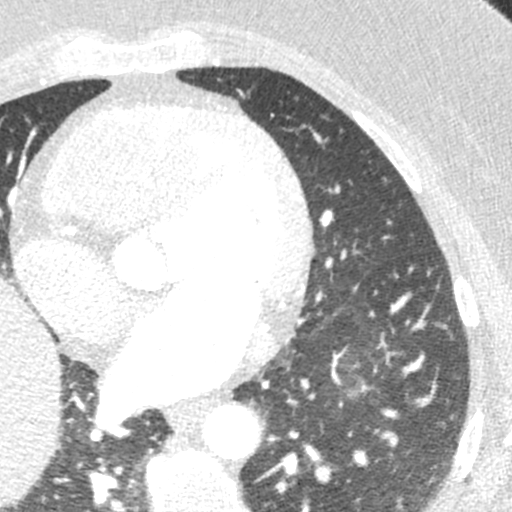

[Series 8: ts syst sharp · axial · 0.39mm/px · z∈[+1147,+1185]mm · 2 of 283 slices shown]
[im 95/283  lung]
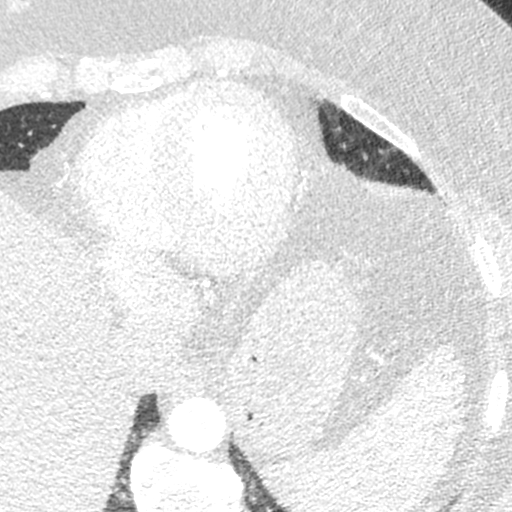
[im 189/283  lung]
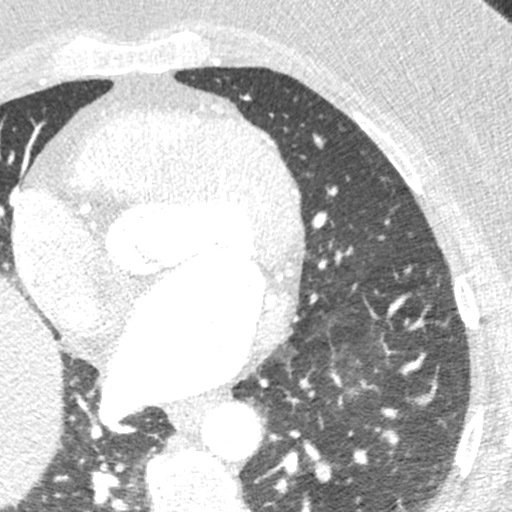

[Series 9: best syst · axial · 0.39mm/px · z∈[+1147,+1185]mm · 2 of 283 slices shown, 3 images]
[im 95/283  vessel]
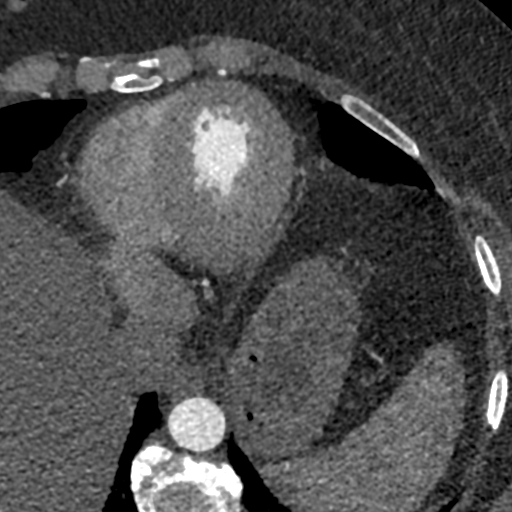
[im 95/283  lung]
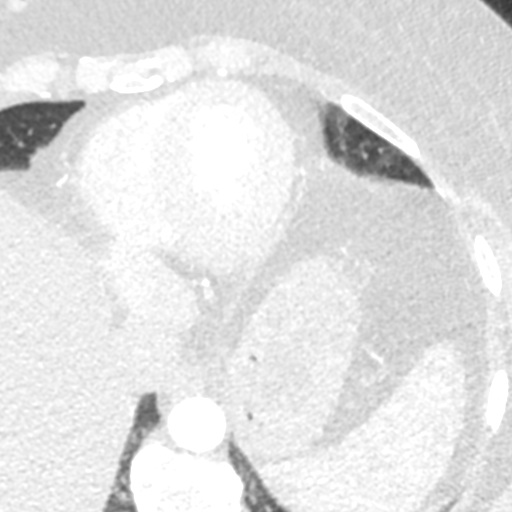
[im 189/283  vessel]
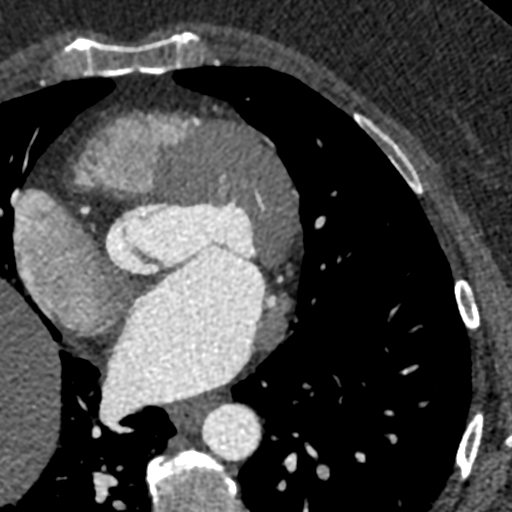

[Series 10: best diast · axial · 0.39mm/px · z∈[+1147,+1185]mm · 2 of 283 slices shown]
[im 95/283  vessel]
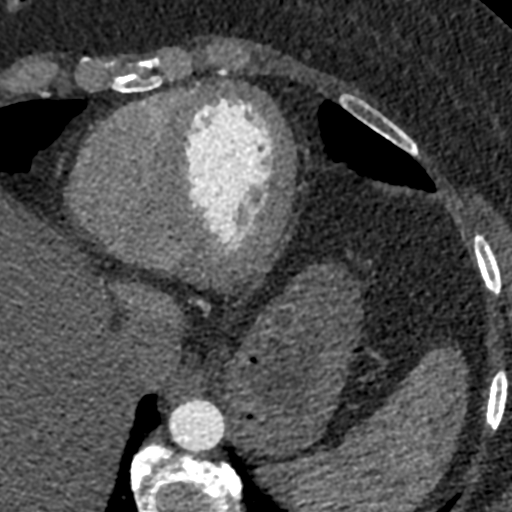
[im 189/283  vessel]
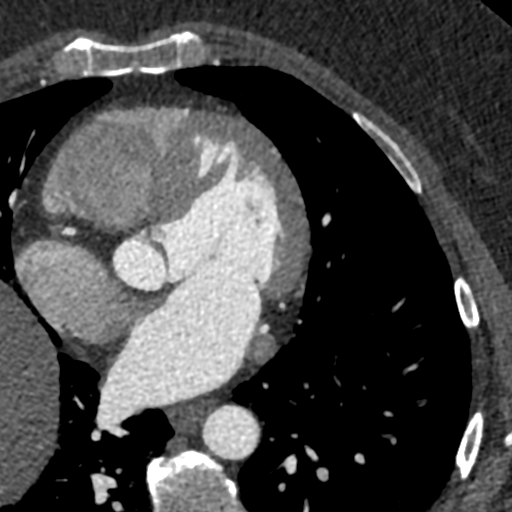

[8 of 20 positions shown; findings below may reference images not displayed]

FINDINGS: Non-cardiac: See separate report from [REDACTED]. No
significant findings on limited lung and soft tissue windows.

Calcium Score: One isolated area of calcium noted in mid LAD

Coronary Arteries: Left  dominant with no anomalies

LM: Normal

LAD: 1-24% calcified plaque in mid vessel

D1: Normal

D2: Normal

Circumflex: Dominant normal

OM1: Normal

OM2: Normal

PDA: Normal

PLB: Normal

RCA: Small non dominant normal
IMPRESSION: 1.  Calcium score 0.84 which is 74 th percentile for age/sex

2.  Upper normal ascending thoracic aorta 3.7 cm

3.  CAD RADS 1 non obstructive CAD see description above

4.  Left dominant coronary arteries

5.  PFO present

Emtithal Abdela

EXAM:
OVER-READ INTERPRETATION  CT CHEST

The following report is an over-read performed by radiologist Dr.
over-read does not include interpretation of cardiac or coronary
anatomy or pathology. The coronary CTA interpretation by the
cardiologist is attached.
FINDINGS: No mediastinal mass or adenopathy identified.

No pleural effusion, airspace consolidation or atelectasis
identified.

No acute abnormality within the imaged portions of the upper
abdomen.

No acute or suspicious osseous findings. Degenerative disc disease
noted within the thoracic spine.
IMPRESSION: No significant noncardiac supplemental findings.

*** End of Addendum ***
FINDINGS: Non-cardiac: See separate report from [REDACTED]. No
significant findings on limited lung and soft tissue windows.

Calcium Score: One isolated area of calcium noted in mid LAD

Coronary Arteries: Left  dominant with no anomalies

LM: Normal

LAD: 1-24% calcified plaque in mid vessel

D1: Normal

D2: Normal

Circumflex: Dominant normal

OM1: Normal

OM2: Normal

PDA: Normal

PLB: Normal

RCA: Small non dominant normal
IMPRESSION: 1.  Calcium score 0.84 which is 74 th percentile for age/sex

2.  Upper normal ascending thoracic aorta 3.7 cm

3.  CAD RADS 1 non obstructive CAD see description above

4.  Left dominant coronary arteries

5.  PFO present

Emtithal Abdela

## 2023-11-23 NOTE — Telephone Encounter (Signed)
 Clearance letter received 11/20/23. Patient is cleared from a cardiac standpoint per Dr Rosalba. Pt is aware.

## 2023-12-02 NOTE — Progress Notes (Unsigned)
 [facility] History and Physical   Primary Care Physician:  Mikle Beau, FNP   Reason for Procedure:    Encounter Diagnosis  Name Primary?   Esophageal dysphagia Yes     Plan:    EGD, esophageal dilation     HPI: Paige Johns is a 58 y.o. female with a history of recurrent dysphagia problems with previous upper endoscopy exams by me 2012 and 2017 for dysphagia, normal-appearing esophagus empirically dilated with [facility] dilator 26 [place] and she has had benefit.  When seen in the clinic she was having difficulty with liquids and solids as well as substernal chest pressure.  She had seen cardiology and reported a negative evaluation as outlined in the clinic note.  She has a history of IBS issues also and she reported a history of chronic anemia and chronic intermittent iron use as well.  Last colonoscopy 2017 without abnormality that would explain an anemia.  Clearance letter from her cardiologist in [place] has been received.  It is in the media section and reviewed.   Past Medical History:  Diagnosis Date   Anemia    Arthritis    CAD (coronary artery disease), native coronary artery    coronary CTA 03/2021 with Minimal calcified plaque in the mid LAD < 25%.   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    Diabetes mellitus    Esophageal spasm    Fibromyalgia    GERD (gastroesophageal reflux disease)    Hyperlipidemia    IBS (irritable bowel syndrome)    Insomnia    Iron deficiency anemia    Liver disease    Mouth sores    ulcers   Obesity    PFO (patent foramen ovale)    noted on coronary CTA 03/2021    Past Surgical History:  Procedure Laterality Date   ABDOMINAL HYSTERECTOMY     CHOLECYSTECTOMY  2001   COLONOSCOPY  06/08/2003   normal   groin surgery Right    dermato fibrosarcoma protuberans   TONSILLECTOMY     UPPER GASTROINTESTINAL ENDOSCOPY  06/08/2003, 02/25/2011   2005 - normal 2012 -  erosive esophagitis, 54 Fr dilation for dysphagia     Current Outpatient Medications  Medication Sig Dispense Refill   OZEMPIC, 0.25 OR 0.5 MG/DOSE, 2 MG/3ML SOPN SMARTSIG:0.25 Milligram(s) SUB-Q Once a Week     aspirin  EC 81 MG tablet Take 1 tablet (81 mg total) by mouth daily. Swallow whole. 90 tablet 3   clotrimazole-betamethasone  (LOTRISONE) cream Apply 1 Application topically 2 (two) times daily.     Continuous Glucose Sensor (FREESTYLE LIBRE 3 PLUS SENSOR) MISC Inject 1 each into the skin every 14 (fourteen) days.     diclofenac (VOLTAREN) 75 MG EC tablet Take 75 mg by mouth 2 (two) times daily.     diphenhydrAMINE (BENADRYL) 25 mg capsule Take 25 mg by mouth every 8 (eight) hours as needed.     esomeprazole  (NEXIUM ) 40 MG capsule Take 1 capsule (40 mg total) by mouth daily at 12 noon. 90 capsule 3   fenofibrate (TRICOR) 145 MG tablet Take 145 mg by mouth daily.     fluconazole (DIFLUCAN) 150 MG tablet Take 150 mg by mouth once.     FREESTYLE LITE test strip 1 each by Other route as needed.     glipiZIDE (GLUCOTROL XL) 5 MG 24 hr tablet Take 10 mg by mouth daily.     hydrocortisone  2.5 % cream Apply 1 Application topically  2 (two) times daily.     ibuprofen (ADVIL) 600 MG tablet Take 600 mg by mouth 3 (three) times daily.     loratadine (CLARITIN) 10 MG tablet Take 10 mg by mouth daily.     magnesium  (MAGTAB) 84 MG ( ) TBCR SR tablet Take 84 mg by mouth daily.     meloxicam (MOBIC) 7.5 MG tablet TAKE 1 TABLET BY MOUTH EVERY 12 TO 24 HOURS AS NEEDED FOR JOINT PAIN. DO NOT TAKE NAPROXEN OR ANTI-INFLAMMATORIES     MINIVELLE 0.1 MG/24HR patch Place 1 patch onto the skin 2 (two) times a week.     montelukast (SINGULAIR) 10 MG tablet Take 10 mg by mouth daily.     naproxen (NAPROSYN) 500 MG tablet Take 500 mg by mouth 2 (two) times daily with a meal.     nystatin cream (MYCOSTATIN) APPLY TO SKIN FOLDS TWICE DAILY AS NEEDED FOR RASH     omeprazole  (PRILOSEC) 40 MG capsule Take 1 capsule  (40 mg total) by mouth in the morning and at bedtime. 60 capsule 3   sitaGLIPtin-metformin (JANUMET) 50-1000 MG tablet Take 1 tablet by mouth 2 (two) times daily with a meal.     tirzepatide (MOUNJARO) 5 MG/0.5ML Pen ADMINISTER 0.5 ML UNDER THE SKIN EVERY WEEK     triamcinolone cream (KENALOG) 0.1 % Apply 1 Application topically 2 (two) times daily.     No current facility-administered medications for this visit.    Allergies as of 12/03/2023 - Review Complete 11/03/2023  Allergen Reaction Noted   Oxycodone-acetaminophen Rash     Family History  Problem Relation Age of Onset   Diabetes Mother    Hypertension Mother    Atrial fibrillation Mother    Ataxia Mother    Lung cancer Father    Breast cancer Maternal Aunt        in 37's   Lung cancer Maternal Uncle    Heart disease Maternal Grandmother    CAD Maternal Grandmother    Diabetes Paternal Grandfather    Colon cancer Neg Hx     Social History   Socioeconomic History   Marital status: Married    Spouse name: Not on file   Number of children: 2   Years of education: Not on file   Highest education level: Not on file  Occupational History   Occupation: CSR    Employer: [employer]  Tobacco Use   Smoking status: Former    Current packs/day: 0.00    Types: Cigarettes    Quit date: 04/07/1989    Years since quitting: 34.6   Smokeless tobacco: Never  Vaping Use   Vaping status: Never Used  Substance and Sexual Activity   Alcohol use: No    Alcohol/week: 0.0 standard drinks of alcohol   Drug use: No   Sexual activity: Not on file  Other Topics Concern   Not on file  Social History Narrative   Married 2 daughters + 3 granddaughters   Customer Service specialist [place]   Alcohol no   Tobacco no   Minimal caffeine   Social Drivers of Corporate investment banker Strain: Not on file  Food Insecurity: Not on file  Transportation Needs: Not on file  Physical Activity: Not on file  Stress: Not on file   Social Connections: Not on file  Intimate Partner Violence: Not on file    Review of Systems: Positive for *** All other review of systems negative except as mentioned in the  HPI.  Physical Exam: Vital signs There were no vitals taken for this visit.  General:   Alert,  Well-developed, well-nourished, pleasant and cooperative in NAD Lungs:  Clear throughout to auscultation.   Heart:  Regular rate and rhythm; no murmurs, clicks, rubs,  or gallops. Abdomen:  Soft, nontender and nondistended. Normal bowel sounds.   Neuro/Psych:  Alert and cooperative. Normal mood and affect. A and O x 3   @Tiffine Henigan  CHARLENA Commander, MD, [facility] [phone] (pager) 12/02/2023 9:05 PM@

## 2023-12-03 ENCOUNTER — Encounter: Payer: Self-pay | Admitting: Internal Medicine

## 2023-12-03 ENCOUNTER — Ambulatory Visit: Admitting: Internal Medicine

## 2023-12-03 VITALS — BP 136/76 | HR 66 | Temp 97.9°F | Resp 18 | Ht 65.5 in | Wt 223.0 lb

## 2023-12-03 DIAGNOSIS — K449 Diaphragmatic hernia without obstruction or gangrene: Secondary | ICD-10-CM

## 2023-12-03 DIAGNOSIS — R1319 Other dysphagia: Secondary | ICD-10-CM

## 2023-12-03 DIAGNOSIS — R1310 Dysphagia, unspecified: Secondary | ICD-10-CM | POA: Diagnosis not present

## 2023-12-03 DIAGNOSIS — K219 Gastro-esophageal reflux disease without esophagitis: Secondary | ICD-10-CM | POA: Diagnosis not present

## 2023-12-03 DIAGNOSIS — K222 Esophageal obstruction: Secondary | ICD-10-CM

## 2023-12-03 MED ORDER — SODIUM CHLORIDE 0.9 % IV SOLN: 500.0000 mL | Freq: Once | INTRAVENOUS | Status: AC

## 2023-12-03 NOTE — Progress Notes (Signed)
 Called to room to assist during endoscopic procedure.  Patient ID and intended procedure confirmed with present staff. Received instructions for my participation in the procedure from the performing physician.

## 2023-12-03 NOTE — Progress Notes (Signed)
 Sedate, gd SR, tolerated procedure well, VSS, report to RN

## 2023-12-03 NOTE — Op Note (Signed)
 [facility] Patient Name: Paige Johns Procedure Date: 12/03/2023 9:59 AM MRN: [id number] Endoscopist: Paige Johns , MD, [id number] Age: 58 Referring MD:  Date of Birth: Dec 19, 1965 Gender: Female Account #: [id number] Procedure:                Upper GI endoscopy Indications:              Dysphagia, Esophageal reflux Medicines:                Monitored Anesthesia Care Procedure:                Pre-Anesthesia Assessment:                           - Prior to the procedure, a History and Physical                            was performed, and patient medications and                            allergies were reviewed. The patient's tolerance of                            previous anesthesia was also reviewed. The risks                            and benefits of the procedure and the sedation                            options and risks were discussed with the patient.                            All questions were answered, and informed consent                            was obtained. Prior Anticoagulants: The patient has                            taken no anticoagulant or antiplatelet agents. ASA                            Grade Assessment: III - A patient with severe                            systemic disease. After reviewing the risks and                            benefits, the patient was deemed in satisfactory                            condition to undergo the procedure.                           After obtaining informed consent, the endoscope was  passed under direct vision. Throughout the                            procedure, the patient's blood pressure, pulse, and                            oxygen saturations were monitored continuously. The                            GIF [id number] #[id number] was introduced through the                            mouth, and advanced to the second part of duodenum.                            The upper GI  endoscopy was accomplished without                            difficulty. The patient tolerated the procedure                            well. Scope In: Scope Out: Findings:                 A mild (partial)Schatzki ring was found at the                            gastroesophageal junction. The scope was withdrawn.                            Dilation was performed with a Maloney dilator with                            no resistance at 54 Fr. The dilation site was                            examined following endoscope reinsertion and showed                            no change. Estimated blood loss: none. This was                            biopsied with a cold forceps for disruption after                            no dilation effect with Paige Johns. Estimated blood                            loss was minimal.                           A 1 cm hiatal hernia was present.                           The exam  was otherwise without abnormality.                           The cardia and gastric fundus were otherwise normal                            on retroflexion. Complications:            No immediate complications. Estimated Blood Loss:     Estimated blood loss was minimal. Impression:               - Mild Schatzki ring (partial). Dilated. Biopsied                            for disruption no pathology specimen.                           - 1 cm hiatal hernia.                           - The examination was otherwise normal. Recommendation:           - Patient has a contact number available for                            emergencies. The signs and symptoms of potential                            delayed complications were discussed with the                            patient. Return to normal activities tomorrow.                            Written discharge instructions were provided to the                            patient.                           - Clear liquids x 1 hour then soft foods  rest of                            day. Start prior diet tomorrow.                           - Continue present medications. Paige FORBES Commander, MD 12/03/2023 10:30:19 AM This report has been signed electronically.

## 2023-12-03 NOTE — Patient Instructions (Addendum)
 Clear liquids x 1 hour then soft foods rest of day. Start prior diet tomorrow.  Continue present medications. Handout provided on hiatal hernia.   YOU HAD AN ENDOSCOPIC PROCEDURE TODAY AT THE [facility]:   Refer to the procedure report that was given to you for any specific questions about what was found during the examination.  If the procedure report does not answer your questions, please call your gastroenterologist to clarify.  If you requested that your care partner not be given the details of your procedure findings, then the procedure report has been included in a sealed envelope for you to review at your convenience later.  YOU SHOULD EXPECT: Some feelings of bloating in the abdomen. Passage of more gas than usual.  Walking can help get rid of the air that was put into your GI tract during the procedure and reduce the bloating. If you had a lower endoscopy (such as a colonoscopy or flexible sigmoidoscopy) you may notice spotting of blood in your stool or on the toilet paper. If you underwent a bowel prep for your procedure, you may not have a normal bowel movement for a few days.  Please Note:  You might notice some irritation and congestion in your nose or some drainage.  This is from the oxygen used during your procedure.  There is no need for concern and it should clear up in a day or so.  SYMPTOMS TO REPORT IMMEDIATELY:  Following upper endoscopy (EGD)  Vomiting of blood or coffee ground material  New chest pain or pain under the shoulder blades  Painful or persistently difficult swallowing  New shortness of breath  Fever of 100F or higher  Black, tarry-looking stools  For urgent or emergent issues, a gastroenterologist can be reached at any hour by calling [phone]. Do not use MyChart messaging for urgent concerns.    DIET:  We do recommend a small meal at first, but then you may proceed to your regular diet.  Drink plenty of fluids but you should avoid  alcoholic beverages for 24 hours.  ACTIVITY:  You should plan to take it easy for the rest of today and you should NOT DRIVE or use heavy machinery until tomorrow (because of the sedation medicines used during the test).    FOLLOW UP: Our staff will call the number listed on your records the next business day following your procedure.  We will call around 7:15- 8:00 am to check on you and address any questions or concerns that you may have regarding the information given to you following your procedure. If we do not reach you, we will leave a message.     If any biopsies were taken you will be contacted by phone or by letter within the next 1-3 weeks.  Please call us  at [phone] if you have not heard about the biopsies in 3 weeks.    SIGNATURES/CONFIDENTIALITY: You and/or your care partner have signed paperwork which will be entered into your electronic medical record.  These signatures attest to the fact that that the information above on your After Visit Summary has been reviewed and is understood.  Full responsibility of the confidentiality of this discharge information lies with you and/or your care-partner.I dilated the esophagus as we have done in the past.  There was a little bit of scar tissue called a ring where the esophagus and stomach meet and also cut that slightly with biopsy forceps.  I hope this helps.  I did  see that you really have had iron deficiency anemia for many years as you say so I do not think we need to alter your colonoscopy scheduling.  And you have been checked for celiac disease in the past and do not have it.  I appreciate the opportunity to care for you. Paige CHARLENA Commander, MD, [facility]

## 2023-12-04 ENCOUNTER — Telehealth: Payer: Self-pay | Admitting: *Deleted

## 2023-12-04 NOTE — Telephone Encounter (Signed)
  Follow up Call-     12/03/2023    9:19 AM  Call back number  Post procedure Call Back phone  # [phone]  Permission to leave phone message Yes     Patient questions:  Do you have a fever, pain , or abdominal swelling? No. Pain Score  0 *  Have you tolerated food without any problems? Yes.    Have you been able to return to your normal activities? Yes.    Do you have any questions about your discharge instructions: Diet   No. Medications  No. Follow up visit  No.  Do you have questions or concerns about your Care? No.  Actions: * If pain score is 4 or above: No action needed, pain <4.
